# Patient Record
Sex: Male | Born: 1975 | Race: White | Hispanic: No | Marital: Married | Smoking: Never smoker
Health system: Southern US, Community
[De-identification: ages and names within clinical notes are randomized; demographics above are authoritative.]

---

## 2009-05-16 DIAGNOSIS — D229 Melanocytic nevi, unspecified: Secondary | ICD-10-CM

## 2009-05-16 HISTORY — DX: Melanocytic nevi, unspecified: D22.9

## 2010-02-26 ENCOUNTER — Observation Stay: Payer: Self-pay | Admitting: Internal Medicine

## 2010-02-26 ENCOUNTER — Ambulatory Visit: Payer: Self-pay | Admitting: Cardiovascular Disease

## 2010-03-06 ENCOUNTER — Ambulatory Visit: Payer: Self-pay | Admitting: Internal Medicine

## 2011-06-13 ENCOUNTER — Ambulatory Visit: Payer: Self-pay | Admitting: Unknown Physician Specialty

## 2013-09-03 ENCOUNTER — Ambulatory Visit: Payer: Self-pay | Admitting: Specialist

## 2014-07-30 NOTE — Op Note (Signed)
PATIENT NAME:  Dalton Huff, Dalton Huff MR#:  [id number] DATE OF BIRTH:  28-Apr-1975  DATE OF PROCEDURE:  09/03/2013  PREOPERATIVE DIAGNOSIS: Bilateral carpal tunnel syndrome.   POSTOPERATIVE DIAGNOSIS: Bilateral carpal tunnel syndrome.   PROCEDURES: 1.  Right carpal tunnel release.  2.  Left carpal tunnel release.   SURGEON: Christophe Louis, M.D.   ANESTHESIA: General.   COMPLICATIONS: None.   TOURNIQUET TIME: Approximately 15 minutes on each side.   DESCRIPTION OF PROCEDURE: After adequate induction of general anesthesia, both upper extremities are thoroughly prepped with alcohol and ChloraPrep and draped in standard sterile fashion. Identical procedures are performed on each side. The extremity is wrapped out with the Esmarch bandage and tourniquet elevated to 250 mmHg. On the right side, because of the IV placement in the elbow, with the Esmarch itself was used as the tourniquet. Under loupe magnification, standard volar carpal tunnel incision is made and the dissection carried down to the transverse retinacular ligament. This is incised in the midportion with the knife. The proximal release is performed with the small scissors and the carpal tunnel scissors, and the distal release is performed with the small scissors. There is seen to be moderate compression of the nerve directly beneath the ligament on each side. Careful check is made both proximally and distally to ensure that complete release has been obtained. The wound is thoroughly irrigated multiple times. Skin edges are infiltrated with 0.5% plain Marcaine. The skin is closed with 4-0 nylon. A soft bulky dressing is applied. The tourniquet is released. The patient is returned to the recovery room in satisfactory condition having tolerated the procedures well.    ____________________________ Lucas Mallow, MD ces:dmm D: 09/03/2013 09:11:30 ET T: 09/03/2013 09:36:15 ET JOB#: [id number]  cc: Lucas Mallow, MD,  <Dictator> Lucas Mallow MD ELECTRONICALLY SIGNED 09/11/2013 12:38

## 2015-04-13 DIAGNOSIS — J301 Allergic rhinitis due to pollen: Secondary | ICD-10-CM | POA: Diagnosis not present

## 2015-04-17 DIAGNOSIS — J301 Allergic rhinitis due to pollen: Secondary | ICD-10-CM | POA: Diagnosis not present

## 2015-04-20 DIAGNOSIS — J301 Allergic rhinitis due to pollen: Secondary | ICD-10-CM | POA: Diagnosis not present

## 2015-04-27 DIAGNOSIS — J301 Allergic rhinitis due to pollen: Secondary | ICD-10-CM | POA: Diagnosis not present

## 2015-05-01 DIAGNOSIS — J301 Allergic rhinitis due to pollen: Secondary | ICD-10-CM | POA: Diagnosis not present

## 2015-05-04 DIAGNOSIS — J301 Allergic rhinitis due to pollen: Secondary | ICD-10-CM | POA: Diagnosis not present

## 2015-05-05 DIAGNOSIS — J301 Allergic rhinitis due to pollen: Secondary | ICD-10-CM | POA: Diagnosis not present

## 2015-05-08 DIAGNOSIS — J301 Allergic rhinitis due to pollen: Secondary | ICD-10-CM | POA: Diagnosis not present

## 2015-05-10 DIAGNOSIS — G4733 Obstructive sleep apnea (adult) (pediatric): Secondary | ICD-10-CM | POA: Diagnosis not present

## 2015-05-11 DIAGNOSIS — J301 Allergic rhinitis due to pollen: Secondary | ICD-10-CM | POA: Diagnosis not present

## 2015-05-15 DIAGNOSIS — J301 Allergic rhinitis due to pollen: Secondary | ICD-10-CM | POA: Diagnosis not present

## 2015-05-16 DIAGNOSIS — H8111 Benign paroxysmal vertigo, right ear: Secondary | ICD-10-CM | POA: Diagnosis not present

## 2015-05-18 DIAGNOSIS — H8112 Benign paroxysmal vertigo, left ear: Secondary | ICD-10-CM | POA: Diagnosis not present

## 2015-05-18 DIAGNOSIS — J301 Allergic rhinitis due to pollen: Secondary | ICD-10-CM | POA: Diagnosis not present

## 2015-05-19 DIAGNOSIS — J301 Allergic rhinitis due to pollen: Secondary | ICD-10-CM | POA: Diagnosis not present

## 2015-05-22 DIAGNOSIS — J301 Allergic rhinitis due to pollen: Secondary | ICD-10-CM | POA: Diagnosis not present

## 2015-05-24 DIAGNOSIS — H8110 Benign paroxysmal vertigo, unspecified ear: Secondary | ICD-10-CM | POA: Diagnosis not present

## 2015-05-25 DIAGNOSIS — J301 Allergic rhinitis due to pollen: Secondary | ICD-10-CM | POA: Diagnosis not present

## 2015-05-29 DIAGNOSIS — J301 Allergic rhinitis due to pollen: Secondary | ICD-10-CM | POA: Diagnosis not present

## 2015-05-30 DIAGNOSIS — R42 Dizziness and giddiness: Secondary | ICD-10-CM | POA: Diagnosis not present

## 2015-06-01 DIAGNOSIS — J301 Allergic rhinitis due to pollen: Secondary | ICD-10-CM | POA: Diagnosis not present

## 2015-06-05 DIAGNOSIS — J301 Allergic rhinitis due to pollen: Secondary | ICD-10-CM | POA: Diagnosis not present

## 2015-06-08 DIAGNOSIS — J301 Allergic rhinitis due to pollen: Secondary | ICD-10-CM | POA: Diagnosis not present

## 2015-06-09 DIAGNOSIS — J301 Allergic rhinitis due to pollen: Secondary | ICD-10-CM | POA: Diagnosis not present

## 2015-06-12 DIAGNOSIS — J301 Allergic rhinitis due to pollen: Secondary | ICD-10-CM | POA: Diagnosis not present

## 2015-06-15 DIAGNOSIS — J301 Allergic rhinitis due to pollen: Secondary | ICD-10-CM | POA: Diagnosis not present

## 2015-06-19 DIAGNOSIS — J301 Allergic rhinitis due to pollen: Secondary | ICD-10-CM | POA: Diagnosis not present

## 2015-06-22 DIAGNOSIS — J301 Allergic rhinitis due to pollen: Secondary | ICD-10-CM | POA: Diagnosis not present

## 2015-06-29 DIAGNOSIS — J301 Allergic rhinitis due to pollen: Secondary | ICD-10-CM | POA: Diagnosis not present

## 2015-07-03 DIAGNOSIS — J301 Allergic rhinitis due to pollen: Secondary | ICD-10-CM | POA: Diagnosis not present

## 2015-07-06 DIAGNOSIS — J301 Allergic rhinitis due to pollen: Secondary | ICD-10-CM | POA: Diagnosis not present

## 2015-07-07 DIAGNOSIS — J301 Allergic rhinitis due to pollen: Secondary | ICD-10-CM | POA: Diagnosis not present

## 2015-07-10 DIAGNOSIS — J301 Allergic rhinitis due to pollen: Secondary | ICD-10-CM | POA: Diagnosis not present

## 2015-07-13 DIAGNOSIS — J301 Allergic rhinitis due to pollen: Secondary | ICD-10-CM | POA: Diagnosis not present

## 2015-07-20 DIAGNOSIS — J301 Allergic rhinitis due to pollen: Secondary | ICD-10-CM | POA: Diagnosis not present

## 2015-07-24 DIAGNOSIS — J301 Allergic rhinitis due to pollen: Secondary | ICD-10-CM | POA: Diagnosis not present

## 2015-07-27 DIAGNOSIS — J301 Allergic rhinitis due to pollen: Secondary | ICD-10-CM | POA: Diagnosis not present

## 2015-07-31 DIAGNOSIS — J301 Allergic rhinitis due to pollen: Secondary | ICD-10-CM | POA: Diagnosis not present

## 2015-08-08 DIAGNOSIS — G4733 Obstructive sleep apnea (adult) (pediatric): Secondary | ICD-10-CM | POA: Diagnosis not present

## 2015-08-09 DIAGNOSIS — J301 Allergic rhinitis due to pollen: Secondary | ICD-10-CM | POA: Diagnosis not present

## 2015-08-16 DIAGNOSIS — J301 Allergic rhinitis due to pollen: Secondary | ICD-10-CM | POA: Diagnosis not present

## 2015-09-07 DIAGNOSIS — J301 Allergic rhinitis due to pollen: Secondary | ICD-10-CM | POA: Diagnosis not present

## 2015-09-13 DIAGNOSIS — R42 Dizziness and giddiness: Secondary | ICD-10-CM | POA: Diagnosis not present

## 2015-09-13 DIAGNOSIS — J301 Allergic rhinitis due to pollen: Secondary | ICD-10-CM | POA: Diagnosis not present

## 2015-09-14 DIAGNOSIS — J301 Allergic rhinitis due to pollen: Secondary | ICD-10-CM | POA: Diagnosis not present

## 2015-09-25 DIAGNOSIS — J301 Allergic rhinitis due to pollen: Secondary | ICD-10-CM | POA: Diagnosis not present

## 2015-09-29 DIAGNOSIS — J301 Allergic rhinitis due to pollen: Secondary | ICD-10-CM | POA: Diagnosis not present

## 2015-10-03 DIAGNOSIS — J301 Allergic rhinitis due to pollen: Secondary | ICD-10-CM | POA: Diagnosis not present

## 2015-11-08 DIAGNOSIS — G4733 Obstructive sleep apnea (adult) (pediatric): Secondary | ICD-10-CM | POA: Diagnosis not present

## 2016-01-02 DIAGNOSIS — I10 Essential (primary) hypertension: Secondary | ICD-10-CM | POA: Diagnosis not present

## 2016-01-02 DIAGNOSIS — Z79899 Other long term (current) drug therapy: Secondary | ICD-10-CM | POA: Diagnosis not present

## 2016-01-02 DIAGNOSIS — J329 Chronic sinusitis, unspecified: Secondary | ICD-10-CM | POA: Diagnosis not present

## 2016-01-02 DIAGNOSIS — Z0000 Encounter for general adult medical examination without abnormal findings: Secondary | ICD-10-CM | POA: Diagnosis not present

## 2016-01-03 DIAGNOSIS — Z1329 Encounter for screening for other suspected endocrine disorder: Secondary | ICD-10-CM | POA: Diagnosis not present

## 2016-01-03 DIAGNOSIS — Z13220 Encounter for screening for lipoid disorders: Secondary | ICD-10-CM | POA: Diagnosis not present

## 2016-01-03 DIAGNOSIS — F39 Unspecified mood [affective] disorder: Secondary | ICD-10-CM | POA: Diagnosis not present

## 2016-01-03 DIAGNOSIS — Z125 Encounter for screening for malignant neoplasm of prostate: Secondary | ICD-10-CM | POA: Diagnosis not present

## 2016-01-03 DIAGNOSIS — Z79899 Other long term (current) drug therapy: Secondary | ICD-10-CM | POA: Diagnosis not present

## 2016-01-03 DIAGNOSIS — Z131 Encounter for screening for diabetes mellitus: Secondary | ICD-10-CM | POA: Diagnosis not present

## 2016-01-03 DIAGNOSIS — I10 Essential (primary) hypertension: Secondary | ICD-10-CM | POA: Diagnosis not present

## 2016-02-08 DIAGNOSIS — G4733 Obstructive sleep apnea (adult) (pediatric): Secondary | ICD-10-CM | POA: Diagnosis not present

## 2016-02-13 DIAGNOSIS — I10 Essential (primary) hypertension: Secondary | ICD-10-CM | POA: Diagnosis not present

## 2016-02-13 DIAGNOSIS — Z23 Encounter for immunization: Secondary | ICD-10-CM | POA: Diagnosis not present

## 2016-03-08 DIAGNOSIS — J301 Allergic rhinitis due to pollen: Secondary | ICD-10-CM | POA: Diagnosis not present

## 2016-05-10 DIAGNOSIS — G4733 Obstructive sleep apnea (adult) (pediatric): Secondary | ICD-10-CM | POA: Diagnosis not present

## 2016-05-20 DIAGNOSIS — J301 Allergic rhinitis due to pollen: Secondary | ICD-10-CM | POA: Diagnosis not present

## 2016-06-03 DIAGNOSIS — J301 Allergic rhinitis due to pollen: Secondary | ICD-10-CM | POA: Diagnosis not present

## 2016-06-10 DIAGNOSIS — J301 Allergic rhinitis due to pollen: Secondary | ICD-10-CM | POA: Diagnosis not present

## 2016-06-11 DIAGNOSIS — J301 Allergic rhinitis due to pollen: Secondary | ICD-10-CM | POA: Diagnosis not present

## 2016-06-13 DIAGNOSIS — J0140 Acute pansinusitis, unspecified: Secondary | ICD-10-CM | POA: Diagnosis not present

## 2016-06-13 DIAGNOSIS — R42 Dizziness and giddiness: Secondary | ICD-10-CM | POA: Diagnosis not present

## 2016-07-08 DIAGNOSIS — J301 Allergic rhinitis due to pollen: Secondary | ICD-10-CM | POA: Diagnosis not present

## 2016-07-18 DIAGNOSIS — J301 Allergic rhinitis due to pollen: Secondary | ICD-10-CM | POA: Diagnosis not present

## 2016-07-31 DIAGNOSIS — J301 Allergic rhinitis due to pollen: Secondary | ICD-10-CM | POA: Diagnosis not present

## 2016-08-13 DIAGNOSIS — R7309 Other abnormal glucose: Secondary | ICD-10-CM | POA: Diagnosis not present

## 2016-08-13 DIAGNOSIS — I10 Essential (primary) hypertension: Secondary | ICD-10-CM | POA: Diagnosis not present

## 2016-08-13 DIAGNOSIS — G4733 Obstructive sleep apnea (adult) (pediatric): Secondary | ICD-10-CM | POA: Diagnosis not present

## 2016-08-13 DIAGNOSIS — Z79899 Other long term (current) drug therapy: Secondary | ICD-10-CM | POA: Diagnosis not present

## 2016-09-11 DIAGNOSIS — G4733 Obstructive sleep apnea (adult) (pediatric): Secondary | ICD-10-CM | POA: Diagnosis not present

## 2016-09-11 DIAGNOSIS — J0140 Acute pansinusitis, unspecified: Secondary | ICD-10-CM | POA: Diagnosis not present

## 2016-09-11 DIAGNOSIS — J301 Allergic rhinitis due to pollen: Secondary | ICD-10-CM | POA: Diagnosis not present

## 2016-09-13 DIAGNOSIS — J301 Allergic rhinitis due to pollen: Secondary | ICD-10-CM | POA: Diagnosis not present

## 2016-10-02 DIAGNOSIS — G4733 Obstructive sleep apnea (adult) (pediatric): Secondary | ICD-10-CM | POA: Diagnosis not present

## 2016-10-07 DIAGNOSIS — J301 Allergic rhinitis due to pollen: Secondary | ICD-10-CM | POA: Diagnosis not present

## 2016-10-08 DIAGNOSIS — G4733 Obstructive sleep apnea (adult) (pediatric): Secondary | ICD-10-CM | POA: Diagnosis not present

## 2016-10-10 DIAGNOSIS — J301 Allergic rhinitis due to pollen: Secondary | ICD-10-CM | POA: Diagnosis not present

## 2016-10-23 DIAGNOSIS — J301 Allergic rhinitis due to pollen: Secondary | ICD-10-CM | POA: Diagnosis not present

## 2016-11-08 DIAGNOSIS — G4733 Obstructive sleep apnea (adult) (pediatric): Secondary | ICD-10-CM | POA: Diagnosis not present

## 2016-11-25 DIAGNOSIS — J301 Allergic rhinitis due to pollen: Secondary | ICD-10-CM | POA: Diagnosis not present

## 2016-12-06 DIAGNOSIS — J301 Allergic rhinitis due to pollen: Secondary | ICD-10-CM | POA: Diagnosis not present

## 2016-12-09 DIAGNOSIS — G4733 Obstructive sleep apnea (adult) (pediatric): Secondary | ICD-10-CM | POA: Diagnosis not present

## 2016-12-16 DIAGNOSIS — J301 Allergic rhinitis due to pollen: Secondary | ICD-10-CM | POA: Diagnosis not present

## 2016-12-19 DIAGNOSIS — M5412 Radiculopathy, cervical region: Secondary | ICD-10-CM | POA: Diagnosis not present

## 2016-12-19 DIAGNOSIS — M9901 Segmental and somatic dysfunction of cervical region: Secondary | ICD-10-CM | POA: Diagnosis not present

## 2016-12-19 DIAGNOSIS — M9902 Segmental and somatic dysfunction of thoracic region: Secondary | ICD-10-CM | POA: Diagnosis not present

## 2016-12-19 DIAGNOSIS — M62830 Muscle spasm of back: Secondary | ICD-10-CM | POA: Diagnosis not present

## 2016-12-23 DIAGNOSIS — M9901 Segmental and somatic dysfunction of cervical region: Secondary | ICD-10-CM | POA: Diagnosis not present

## 2016-12-23 DIAGNOSIS — M62830 Muscle spasm of back: Secondary | ICD-10-CM | POA: Diagnosis not present

## 2016-12-23 DIAGNOSIS — M5412 Radiculopathy, cervical region: Secondary | ICD-10-CM | POA: Diagnosis not present

## 2016-12-23 DIAGNOSIS — M9902 Segmental and somatic dysfunction of thoracic region: Secondary | ICD-10-CM | POA: Diagnosis not present

## 2016-12-24 DIAGNOSIS — M9902 Segmental and somatic dysfunction of thoracic region: Secondary | ICD-10-CM | POA: Diagnosis not present

## 2016-12-24 DIAGNOSIS — M9901 Segmental and somatic dysfunction of cervical region: Secondary | ICD-10-CM | POA: Diagnosis not present

## 2016-12-24 DIAGNOSIS — M62830 Muscle spasm of back: Secondary | ICD-10-CM | POA: Diagnosis not present

## 2016-12-24 DIAGNOSIS — M5412 Radiculopathy, cervical region: Secondary | ICD-10-CM | POA: Diagnosis not present

## 2016-12-26 DIAGNOSIS — M9902 Segmental and somatic dysfunction of thoracic region: Secondary | ICD-10-CM | POA: Diagnosis not present

## 2016-12-26 DIAGNOSIS — M62830 Muscle spasm of back: Secondary | ICD-10-CM | POA: Diagnosis not present

## 2016-12-26 DIAGNOSIS — M5412 Radiculopathy, cervical region: Secondary | ICD-10-CM | POA: Diagnosis not present

## 2016-12-26 DIAGNOSIS — M9901 Segmental and somatic dysfunction of cervical region: Secondary | ICD-10-CM | POA: Diagnosis not present

## 2016-12-30 DIAGNOSIS — M5412 Radiculopathy, cervical region: Secondary | ICD-10-CM | POA: Diagnosis not present

## 2016-12-30 DIAGNOSIS — J301 Allergic rhinitis due to pollen: Secondary | ICD-10-CM | POA: Diagnosis not present

## 2016-12-30 DIAGNOSIS — M9902 Segmental and somatic dysfunction of thoracic region: Secondary | ICD-10-CM | POA: Diagnosis not present

## 2016-12-30 DIAGNOSIS — M9901 Segmental and somatic dysfunction of cervical region: Secondary | ICD-10-CM | POA: Diagnosis not present

## 2016-12-30 DIAGNOSIS — M62830 Muscle spasm of back: Secondary | ICD-10-CM | POA: Diagnosis not present

## 2017-01-01 DIAGNOSIS — M62830 Muscle spasm of back: Secondary | ICD-10-CM | POA: Diagnosis not present

## 2017-01-01 DIAGNOSIS — M5412 Radiculopathy, cervical region: Secondary | ICD-10-CM | POA: Diagnosis not present

## 2017-01-01 DIAGNOSIS — M9902 Segmental and somatic dysfunction of thoracic region: Secondary | ICD-10-CM | POA: Diagnosis not present

## 2017-01-01 DIAGNOSIS — M9901 Segmental and somatic dysfunction of cervical region: Secondary | ICD-10-CM | POA: Diagnosis not present

## 2017-01-02 DIAGNOSIS — M62830 Muscle spasm of back: Secondary | ICD-10-CM | POA: Diagnosis not present

## 2017-01-02 DIAGNOSIS — M9902 Segmental and somatic dysfunction of thoracic region: Secondary | ICD-10-CM | POA: Diagnosis not present

## 2017-01-02 DIAGNOSIS — G4733 Obstructive sleep apnea (adult) (pediatric): Secondary | ICD-10-CM | POA: Diagnosis not present

## 2017-01-02 DIAGNOSIS — M9901 Segmental and somatic dysfunction of cervical region: Secondary | ICD-10-CM | POA: Diagnosis not present

## 2017-01-02 DIAGNOSIS — M5412 Radiculopathy, cervical region: Secondary | ICD-10-CM | POA: Diagnosis not present

## 2017-01-06 DIAGNOSIS — M9901 Segmental and somatic dysfunction of cervical region: Secondary | ICD-10-CM | POA: Diagnosis not present

## 2017-01-06 DIAGNOSIS — M9902 Segmental and somatic dysfunction of thoracic region: Secondary | ICD-10-CM | POA: Diagnosis not present

## 2017-01-06 DIAGNOSIS — J301 Allergic rhinitis due to pollen: Secondary | ICD-10-CM | POA: Diagnosis not present

## 2017-01-06 DIAGNOSIS — M5412 Radiculopathy, cervical region: Secondary | ICD-10-CM | POA: Diagnosis not present

## 2017-01-06 DIAGNOSIS — M62830 Muscle spasm of back: Secondary | ICD-10-CM | POA: Diagnosis not present

## 2017-01-08 DIAGNOSIS — G4733 Obstructive sleep apnea (adult) (pediatric): Secondary | ICD-10-CM | POA: Diagnosis not present

## 2017-01-08 DIAGNOSIS — M62830 Muscle spasm of back: Secondary | ICD-10-CM | POA: Diagnosis not present

## 2017-01-08 DIAGNOSIS — M5412 Radiculopathy, cervical region: Secondary | ICD-10-CM | POA: Diagnosis not present

## 2017-01-08 DIAGNOSIS — M9901 Segmental and somatic dysfunction of cervical region: Secondary | ICD-10-CM | POA: Diagnosis not present

## 2017-01-08 DIAGNOSIS — M9902 Segmental and somatic dysfunction of thoracic region: Secondary | ICD-10-CM | POA: Diagnosis not present

## 2017-01-09 DIAGNOSIS — M5412 Radiculopathy, cervical region: Secondary | ICD-10-CM | POA: Diagnosis not present

## 2017-01-09 DIAGNOSIS — M9901 Segmental and somatic dysfunction of cervical region: Secondary | ICD-10-CM | POA: Diagnosis not present

## 2017-01-09 DIAGNOSIS — M62830 Muscle spasm of back: Secondary | ICD-10-CM | POA: Diagnosis not present

## 2017-01-09 DIAGNOSIS — M9902 Segmental and somatic dysfunction of thoracic region: Secondary | ICD-10-CM | POA: Diagnosis not present

## 2017-01-13 DIAGNOSIS — J301 Allergic rhinitis due to pollen: Secondary | ICD-10-CM | POA: Diagnosis not present

## 2017-01-14 DIAGNOSIS — M9901 Segmental and somatic dysfunction of cervical region: Secondary | ICD-10-CM | POA: Diagnosis not present

## 2017-01-14 DIAGNOSIS — M5412 Radiculopathy, cervical region: Secondary | ICD-10-CM | POA: Diagnosis not present

## 2017-01-14 DIAGNOSIS — M9902 Segmental and somatic dysfunction of thoracic region: Secondary | ICD-10-CM | POA: Diagnosis not present

## 2017-01-14 DIAGNOSIS — M62830 Muscle spasm of back: Secondary | ICD-10-CM | POA: Diagnosis not present

## 2017-01-16 DIAGNOSIS — M5412 Radiculopathy, cervical region: Secondary | ICD-10-CM | POA: Diagnosis not present

## 2017-01-16 DIAGNOSIS — M62830 Muscle spasm of back: Secondary | ICD-10-CM | POA: Diagnosis not present

## 2017-01-16 DIAGNOSIS — M9901 Segmental and somatic dysfunction of cervical region: Secondary | ICD-10-CM | POA: Diagnosis not present

## 2017-01-16 DIAGNOSIS — M9902 Segmental and somatic dysfunction of thoracic region: Secondary | ICD-10-CM | POA: Diagnosis not present

## 2017-01-20 DIAGNOSIS — J301 Allergic rhinitis due to pollen: Secondary | ICD-10-CM | POA: Diagnosis not present

## 2017-02-04 DIAGNOSIS — R7309 Other abnormal glucose: Secondary | ICD-10-CM | POA: Diagnosis not present

## 2017-02-04 DIAGNOSIS — Z13220 Encounter for screening for lipoid disorders: Secondary | ICD-10-CM | POA: Diagnosis not present

## 2017-02-04 DIAGNOSIS — Z125 Encounter for screening for malignant neoplasm of prostate: Secondary | ICD-10-CM | POA: Diagnosis not present

## 2017-02-04 DIAGNOSIS — Z1329 Encounter for screening for other suspected endocrine disorder: Secondary | ICD-10-CM | POA: Diagnosis not present

## 2017-02-04 DIAGNOSIS — I10 Essential (primary) hypertension: Secondary | ICD-10-CM | POA: Diagnosis not present

## 2017-02-04 DIAGNOSIS — R7989 Other specified abnormal findings of blood chemistry: Secondary | ICD-10-CM | POA: Diagnosis not present

## 2017-02-04 DIAGNOSIS — Z79899 Other long term (current) drug therapy: Secondary | ICD-10-CM | POA: Diagnosis not present

## 2017-02-06 DIAGNOSIS — J301 Allergic rhinitis due to pollen: Secondary | ICD-10-CM | POA: Diagnosis not present

## 2017-02-10 DIAGNOSIS — J301 Allergic rhinitis due to pollen: Secondary | ICD-10-CM | POA: Diagnosis not present

## 2017-02-11 DIAGNOSIS — I10 Essential (primary) hypertension: Secondary | ICD-10-CM | POA: Diagnosis not present

## 2017-02-11 DIAGNOSIS — Z0000 Encounter for general adult medical examination without abnormal findings: Secondary | ICD-10-CM | POA: Diagnosis not present

## 2017-02-11 DIAGNOSIS — G4733 Obstructive sleep apnea (adult) (pediatric): Secondary | ICD-10-CM | POA: Diagnosis not present

## 2017-02-11 DIAGNOSIS — E7800 Pure hypercholesterolemia, unspecified: Secondary | ICD-10-CM | POA: Diagnosis not present

## 2017-02-17 DIAGNOSIS — J301 Allergic rhinitis due to pollen: Secondary | ICD-10-CM | POA: Diagnosis not present

## 2017-03-07 DIAGNOSIS — J301 Allergic rhinitis due to pollen: Secondary | ICD-10-CM | POA: Diagnosis not present

## 2017-03-26 DIAGNOSIS — G4733 Obstructive sleep apnea (adult) (pediatric): Secondary | ICD-10-CM | POA: Diagnosis not present

## 2017-04-02 DIAGNOSIS — J301 Allergic rhinitis due to pollen: Secondary | ICD-10-CM | POA: Diagnosis not present

## 2017-04-14 DIAGNOSIS — J301 Allergic rhinitis due to pollen: Secondary | ICD-10-CM | POA: Diagnosis not present

## 2017-04-22 DIAGNOSIS — M9901 Segmental and somatic dysfunction of cervical region: Secondary | ICD-10-CM | POA: Diagnosis not present

## 2017-04-22 DIAGNOSIS — M62830 Muscle spasm of back: Secondary | ICD-10-CM | POA: Diagnosis not present

## 2017-04-22 DIAGNOSIS — M9902 Segmental and somatic dysfunction of thoracic region: Secondary | ICD-10-CM | POA: Diagnosis not present

## 2017-04-22 DIAGNOSIS — M5412 Radiculopathy, cervical region: Secondary | ICD-10-CM | POA: Diagnosis not present

## 2017-05-20 DIAGNOSIS — M5412 Radiculopathy, cervical region: Secondary | ICD-10-CM | POA: Diagnosis not present

## 2017-05-20 DIAGNOSIS — M62830 Muscle spasm of back: Secondary | ICD-10-CM | POA: Diagnosis not present

## 2017-05-20 DIAGNOSIS — M9901 Segmental and somatic dysfunction of cervical region: Secondary | ICD-10-CM | POA: Diagnosis not present

## 2017-05-20 DIAGNOSIS — M9902 Segmental and somatic dysfunction of thoracic region: Secondary | ICD-10-CM | POA: Diagnosis not present

## 2017-05-30 DIAGNOSIS — J301 Allergic rhinitis due to pollen: Secondary | ICD-10-CM | POA: Diagnosis not present

## 2017-06-09 DIAGNOSIS — G4733 Obstructive sleep apnea (adult) (pediatric): Secondary | ICD-10-CM | POA: Diagnosis not present

## 2017-06-17 DIAGNOSIS — M9902 Segmental and somatic dysfunction of thoracic region: Secondary | ICD-10-CM | POA: Diagnosis not present

## 2017-06-17 DIAGNOSIS — M5412 Radiculopathy, cervical region: Secondary | ICD-10-CM | POA: Diagnosis not present

## 2017-06-17 DIAGNOSIS — M62830 Muscle spasm of back: Secondary | ICD-10-CM | POA: Diagnosis not present

## 2017-06-17 DIAGNOSIS — M9901 Segmental and somatic dysfunction of cervical region: Secondary | ICD-10-CM | POA: Diagnosis not present

## 2017-06-30 DIAGNOSIS — J301 Allergic rhinitis due to pollen: Secondary | ICD-10-CM | POA: Diagnosis not present

## 2017-07-10 DIAGNOSIS — J301 Allergic rhinitis due to pollen: Secondary | ICD-10-CM | POA: Diagnosis not present

## 2017-07-14 DIAGNOSIS — J301 Allergic rhinitis due to pollen: Secondary | ICD-10-CM | POA: Diagnosis not present

## 2017-07-15 DIAGNOSIS — M62830 Muscle spasm of back: Secondary | ICD-10-CM | POA: Diagnosis not present

## 2017-07-15 DIAGNOSIS — M9901 Segmental and somatic dysfunction of cervical region: Secondary | ICD-10-CM | POA: Diagnosis not present

## 2017-07-15 DIAGNOSIS — M5412 Radiculopathy, cervical region: Secondary | ICD-10-CM | POA: Diagnosis not present

## 2017-07-15 DIAGNOSIS — M9902 Segmental and somatic dysfunction of thoracic region: Secondary | ICD-10-CM | POA: Diagnosis not present

## 2017-07-23 ENCOUNTER — Encounter: Payer: 59 | Attending: Internal Medicine | Admitting: Dietician

## 2017-07-23 ENCOUNTER — Encounter: Payer: Self-pay | Admitting: Dietician

## 2017-07-23 VITALS — Ht 64.0 in | Wt 220.2 lb

## 2017-07-23 DIAGNOSIS — M5412 Radiculopathy, cervical region: Secondary | ICD-10-CM | POA: Diagnosis not present

## 2017-07-23 DIAGNOSIS — Z6837 Body mass index (BMI) 37.0-37.9, adult: Secondary | ICD-10-CM | POA: Diagnosis not present

## 2017-07-23 DIAGNOSIS — E669 Obesity, unspecified: Secondary | ICD-10-CM | POA: Insufficient documentation

## 2017-07-23 DIAGNOSIS — M9901 Segmental and somatic dysfunction of cervical region: Secondary | ICD-10-CM | POA: Diagnosis not present

## 2017-07-23 DIAGNOSIS — M62830 Muscle spasm of back: Secondary | ICD-10-CM | POA: Diagnosis not present

## 2017-07-23 DIAGNOSIS — Z713 Dietary counseling and surveillance: Secondary | ICD-10-CM | POA: Diagnosis not present

## 2017-07-23 DIAGNOSIS — J301 Allergic rhinitis due to pollen: Secondary | ICD-10-CM | POA: Diagnosis not present

## 2017-07-23 DIAGNOSIS — E6609 Other obesity due to excess calories: Secondary | ICD-10-CM

## 2017-07-23 DIAGNOSIS — M9902 Segmental and somatic dysfunction of thoracic region: Secondary | ICD-10-CM | POA: Diagnosis not present

## 2017-07-23 NOTE — Progress Notes (Signed)
Medical Nutrition Therapy: Visit start time: 1941  end time: 1430 Assessment:  Diagnosis: obesity Past medical history: see chart Psychosocial issues/ stress concerns: none Preferred learning method:  . Auditory . Visual . Hands-on  Current weight: 220.2#  Height: 5\' 4"  Medications, supplements: Zyrtec  Progress and evaluation: Patient is seeking dietary guidance to find a more sustainable approach to eating which provides him energy and keeps him feeling full. Up until last week he had been following the ketogenic diet loosely since September 2018 and more rigidly since January 2019 where 5-10% of his daily caloric intake included carbohydrate. Reports "falling off" this week d/t increased life stressors and also reports to have taken several weekends "off" while following the diet. Prior to September 2018 he had been following a low fat diet where his eating pattern included 6 meals / day. Reported feeling hungry often and more sluggish on a higher carbohydrate diet. With the ketogenic diet + intermittent fasting he felt less hungry and higher energy levels. Would fast 4-5 hours after his morning workouts. Currently uses MyFitnessPal to track his daily calories/ macro targets and weights himself daily. He is interested in utilizing flexible dieting and following a higher-fat (non-keto) diet to help achieve more balance and to lose weight.   Physical activity: Resistance training and cardio 5-6 days / week for 45-90 min each session. Works out fasted in the mornings.  Dietary Intake:  Usual eating pattern includes 2 meals and 0-1 snacks per day. Dining out frequency: 5 meals per week.  Breakfast: none usually Snack: none Lunch: Tangent (3 shrimp / korean beef + veggie tacos) Snack: macadamia nuts or pecans - rarely Supper: hamburger sandwich + grits, vegetables, chicken Snack: none Beverages: coffee (heavy cream on keto or sugar sometimes), water, unsweetened iced tea, diet  soda  Nutrition Care Education:m Topics covered: ketogenic diet vs. Higher fat diet with less restriction, role that carbohydrates play in weight lifting, intermittent fasting, sustainable non-diet approaches to eating, estimated daily calorie needs for activity level, nutrient timing, Basic nutrition: basic food groups, appropriate nutrient balance, appropriate meal and snack schedule, general nutrition guidelines    Weight control: benefits of weight control, identifying healthy weight, determining reasonable weight goal, behavioral changes for weight loss Advanced nutrition: dining out, counting macros and providing macro percentages, flexible dieting Other lifestyle changes: benefits of making changes, readiness for change, identifying habits that need to change  Nutritional Diagnosis:  Carl-3.3 Overweight/obesity As related to dietary habits, coming off of the ketogenic diet.  As evidenced by BMI 37.8, patient report.  Intervention: Discussion as noted above. He will trial new / less restrictive macronutrient distribution and daily calorie target until our next session, assessing for energy levels, fullness/ hunger and ability to make this way of eating sustainable long-term. Initial wt loss goal of 10# was set. He will also focus on eating a post-workout meal that is protein and carbohydrate dense. To assess diet quality in more detail next session.  Education Materials given:  Marland Kitchen Goals/ instructions  Learner/ who was taught:  . Patient  Level of understanding: Marland Kitchen Verbalizes/ demonstrates competency  Demonstrated degree of understanding via:   Teach back Learning barriers: . None  Willingness to learn/ readiness for change: . Eager, change in progress  Monitoring and Evaluation:  Dietary intake, exercise, hunger / satiety, and body weight      follow up: in 3 week(s): May 8th

## 2017-07-23 NOTE — Patient Instructions (Addendum)
   Set initial weight loss goal of 10# with weight loss from 0.5-2# of body weight per week  Maintenance calories with current activity: 2700 calories  Weight loss: subtract 250-500 calories per day and adjust as needed  Macro percentage ideas: 30% P, 40% C, 30% F OR 30% P, 30-35% C, 35-40% F  Plan carbohydrate intake around your workouts. Post workout meal should contain both protein and carbohydate

## 2017-07-28 DIAGNOSIS — J301 Allergic rhinitis due to pollen: Secondary | ICD-10-CM | POA: Diagnosis not present

## 2017-08-04 DIAGNOSIS — J301 Allergic rhinitis due to pollen: Secondary | ICD-10-CM | POA: Diagnosis not present

## 2017-08-06 DIAGNOSIS — E7800 Pure hypercholesterolemia, unspecified: Secondary | ICD-10-CM | POA: Diagnosis not present

## 2017-08-06 DIAGNOSIS — R7309 Other abnormal glucose: Secondary | ICD-10-CM | POA: Diagnosis not present

## 2017-08-11 DIAGNOSIS — J301 Allergic rhinitis due to pollen: Secondary | ICD-10-CM | POA: Diagnosis not present

## 2017-08-13 ENCOUNTER — Ambulatory Visit: Payer: 59 | Admitting: Dietician

## 2017-08-13 DIAGNOSIS — M9902 Segmental and somatic dysfunction of thoracic region: Secondary | ICD-10-CM | POA: Diagnosis not present

## 2017-08-13 DIAGNOSIS — M62830 Muscle spasm of back: Secondary | ICD-10-CM | POA: Diagnosis not present

## 2017-08-13 DIAGNOSIS — I10 Essential (primary) hypertension: Secondary | ICD-10-CM | POA: Diagnosis not present

## 2017-08-13 DIAGNOSIS — M5412 Radiculopathy, cervical region: Secondary | ICD-10-CM | POA: Diagnosis not present

## 2017-08-13 DIAGNOSIS — M9901 Segmental and somatic dysfunction of cervical region: Secondary | ICD-10-CM | POA: Diagnosis not present

## 2017-08-13 DIAGNOSIS — E7800 Pure hypercholesterolemia, unspecified: Secondary | ICD-10-CM | POA: Diagnosis not present

## 2017-08-22 DIAGNOSIS — J301 Allergic rhinitis due to pollen: Secondary | ICD-10-CM | POA: Diagnosis not present

## 2017-08-25 DIAGNOSIS — J301 Allergic rhinitis due to pollen: Secondary | ICD-10-CM | POA: Diagnosis not present

## 2017-08-25 DIAGNOSIS — G4733 Obstructive sleep apnea (adult) (pediatric): Secondary | ICD-10-CM | POA: Diagnosis not present

## 2017-08-29 ENCOUNTER — Encounter: Payer: 59 | Attending: Internal Medicine | Admitting: Dietician

## 2017-08-29 ENCOUNTER — Encounter: Payer: Self-pay | Admitting: Dietician

## 2017-08-29 VITALS — Wt 222.9 lb

## 2017-08-29 DIAGNOSIS — S40869A Insect bite (nonvenomous) of unspecified upper arm, initial encounter: Secondary | ICD-10-CM | POA: Diagnosis not present

## 2017-08-29 DIAGNOSIS — E669 Obesity, unspecified: Secondary | ICD-10-CM | POA: Insufficient documentation

## 2017-08-29 DIAGNOSIS — L919 Hypertrophic disorder of the skin, unspecified: Secondary | ICD-10-CM | POA: Diagnosis not present

## 2017-08-29 DIAGNOSIS — Z1283 Encounter for screening for malignant neoplasm of skin: Secondary | ICD-10-CM | POA: Diagnosis not present

## 2017-08-29 DIAGNOSIS — D485 Neoplasm of uncertain behavior of skin: Secondary | ICD-10-CM | POA: Diagnosis not present

## 2017-08-29 DIAGNOSIS — Z713 Dietary counseling and surveillance: Secondary | ICD-10-CM | POA: Diagnosis not present

## 2017-08-29 DIAGNOSIS — D2270 Melanocytic nevi of unspecified lower limb, including hip: Secondary | ICD-10-CM | POA: Diagnosis not present

## 2017-08-29 DIAGNOSIS — E6609 Other obesity due to excess calories: Secondary | ICD-10-CM

## 2017-08-29 DIAGNOSIS — L570 Actinic keratosis: Secondary | ICD-10-CM | POA: Diagnosis not present

## 2017-08-29 DIAGNOSIS — D225 Melanocytic nevi of trunk: Secondary | ICD-10-CM | POA: Diagnosis not present

## 2017-08-29 DIAGNOSIS — D1800 Hemangioma unspecified site: Secondary | ICD-10-CM | POA: Diagnosis not present

## 2017-08-29 DIAGNOSIS — Z6837 Body mass index (BMI) 37.0-37.9, adult: Secondary | ICD-10-CM | POA: Diagnosis not present

## 2017-08-29 DIAGNOSIS — Z6838 Body mass index (BMI) 38.0-38.9, adult: Secondary | ICD-10-CM

## 2017-08-29 DIAGNOSIS — D2260 Melanocytic nevi of unspecified upper limb, including shoulder: Secondary | ICD-10-CM | POA: Diagnosis not present

## 2017-08-29 DIAGNOSIS — L812 Freckles: Secondary | ICD-10-CM | POA: Diagnosis not present

## 2017-08-29 NOTE — Progress Notes (Signed)
Medical Nutrition Therapy: Visit start time: 1000  end time: 1030  Assessment:  Diagnosis: Obesity Medical history changes: none Psychosocial issues/ stress concerns: reports family/work life to be temporarily more stressful than normal  Current weight: 222.9#  Height: 5\' 4"  Medications, supplement changes: None  Progress and evaluation: Patient reports increased traveling, decreased physical activity, increased stress all which have impacted his food choices and consistency with keeping track of number of calories consumed. He has been trying to eat 2200 kcal/day, using a 40% fat/ 30% carb/ 30% protein split, and will overestimate calories when eating out d/t high likelihood of hidden calories/fat. Overall the family has been eating out more often but he tries to look up the nutrition information ahead of time when possible. Continues to fast until 12-1pm unless he works out in the morning. He feels that his energy levels and recovery from exercise have improved from eating a post-workout meal that contains carbs + protein + fat which he enjoys. Has been struggling slightly with the increased flexibility of adding more carbs to his diet, but knows that this will be a more sustainable approach for him long-term, rather than sticking to a strict high-fat diet and then "falling off" the plan every week or couple of weeks. Feels that he needs to be more conscious of the portions of carbs he is adding to meals and also of the amount of salt he his consuming d/t recent HTN. He had previously been adding salt to his diet while on keto. (08/13/17) BP 140/98, Chol 212 H, LDL 146 H which per his report labs have improved since last October. Overall his energy level is high and his sleep has improved.      Physical activity: Continues weight lifting and the occasional walking/cardio, but days in the gym have decreased to 2-3 days/week over the last couple of weeks rather than 5-6. Using lighter weights than he  typically would d/t back pain.  Dietary Intake:  Usual eating pattern includes 2-3 meals and 0-1 snacks per day. Dining out frequency: 5-7 meals per week.  Breakfast: usually skips unless working out. On workout days he will have eggs, bacon and toast without butter Snacks: none Lunch and Supper: the family has been eating out more d/t time constraints. [place] salad or spicy chicken sandwich, [employer], [place] burrito with chips are all typical choices  Nutrition Care Education: Topics covered: *Food Diary Weight management guidelinesgeneral, re-integrating CHO sources / controlled portions after following the keto diet, post workout nutrition and meal balance, learning portion sizes in order to transition off of tracking foods in MyFitness Pal, finding macro ratios that allow for flexibility of food choices when life gets stressful and routine is disrupted, lowering BP through exercise, eating out Basic nutrition: basic food groups, appropriate nutrient balance, appropriate meal and snack schedule, general nutrition guidelines    Weight control: benefits of weight control, determining reasonable weight goal (short-term goal of 3-5# wt loss x6 weeks), behavioral changes for weight loss Advanced nutrition: recipe modification, cooking techniques, dining out, food label reading Hypertension: importance of controlling BP, identifying high sodium foods, using less salt d/t being off the keto diet Hyperlipidemia: target goals for lipids, healthy and unhealthy fats Other lifestyle changes: benefits of making changes, increasing motivation, readiness for change, identifying habits that need to change  Nutritional Diagnosis:  NI-1.7 Predicted excessive energy intake As related to decreased physical activity and increased traveling.  As evidenced by patient report, abnormal food choices,  and wt gain of 3# since last session.  Intervention: Discussion as noted above. He will  practice paying closer attention to portion sizes, particularly fat and carbohydrates as well as the sodium content in foods d/t recent elevation in blood pressure and blood lipids. He would also like to learn proper portion sizes for his needs so that he can transition from tracking foods to "eyeballing" portion sizes. He will also start to get back into the habit of choosing lower fat items and eating at home more often. He will continue to track his calories in MyFitness Pal as consistently as possible until next session. Goal for wt loss is 3-5# x6 weeks.  Education Materials given:  Marland Kitchen Goals/ instructions  Learner/ who was taught:  . Patient   Level of understanding: Marland Kitchen Verbalizes/ demonstrates competency  Demonstrated degree of understanding via:   Teach back Learning barriers: . None  Willingness to learn/ readiness for change: . Eager, change in progress  Monitoring and Evaluation:  Dietary intake, exercise, and body weight      follow up: in 6 week(s): October 08, 2017

## 2017-09-08 DIAGNOSIS — J301 Allergic rhinitis due to pollen: Secondary | ICD-10-CM | POA: Diagnosis not present

## 2017-09-09 DIAGNOSIS — M9902 Segmental and somatic dysfunction of thoracic region: Secondary | ICD-10-CM | POA: Diagnosis not present

## 2017-09-09 DIAGNOSIS — M9901 Segmental and somatic dysfunction of cervical region: Secondary | ICD-10-CM | POA: Diagnosis not present

## 2017-09-09 DIAGNOSIS — M5412 Radiculopathy, cervical region: Secondary | ICD-10-CM | POA: Diagnosis not present

## 2017-09-09 DIAGNOSIS — M62830 Muscle spasm of back: Secondary | ICD-10-CM | POA: Diagnosis not present

## 2017-09-11 ENCOUNTER — Other Ambulatory Visit: Payer: Self-pay | Admitting: Unknown Physician Specialty

## 2017-09-11 DIAGNOSIS — G4733 Obstructive sleep apnea (adult) (pediatric): Secondary | ICD-10-CM | POA: Diagnosis not present

## 2017-09-11 DIAGNOSIS — R430 Anosmia: Secondary | ICD-10-CM

## 2017-09-11 DIAGNOSIS — J309 Allergic rhinitis, unspecified: Secondary | ICD-10-CM | POA: Diagnosis not present

## 2017-09-22 ENCOUNTER — Ambulatory Visit
Admission: RE | Admit: 2017-09-22 | Discharge: 2017-09-22 | Disposition: A | Discharge status: Home / Self Care | Payer: 59 | Source: Ambulatory Visit | Attending: Unknown Physician Specialty | Admitting: Unknown Physician Specialty

## 2017-09-22 DIAGNOSIS — R430 Anosmia: Secondary | ICD-10-CM | POA: Insufficient documentation

## 2017-09-22 MED ORDER — GADOBENATE DIMEGLUMINE 529 MG/ML IV SOLN
20.0000 mL | Freq: Once | INTRAVENOUS | Status: AC | PRN
  Administered 2017-09-22: 20 mL via INTRAVENOUS

## 2017-09-24 DIAGNOSIS — J301 Allergic rhinitis due to pollen: Secondary | ICD-10-CM | POA: Diagnosis not present

## 2017-10-07 DIAGNOSIS — M9901 Segmental and somatic dysfunction of cervical region: Secondary | ICD-10-CM | POA: Diagnosis not present

## 2017-10-07 DIAGNOSIS — M9902 Segmental and somatic dysfunction of thoracic region: Secondary | ICD-10-CM | POA: Diagnosis not present

## 2017-10-07 DIAGNOSIS — M62830 Muscle spasm of back: Secondary | ICD-10-CM | POA: Diagnosis not present

## 2017-10-07 DIAGNOSIS — M5412 Radiculopathy, cervical region: Secondary | ICD-10-CM | POA: Diagnosis not present

## 2017-10-08 ENCOUNTER — Encounter: Payer: 59 | Attending: Internal Medicine | Admitting: Dietician

## 2017-10-08 ENCOUNTER — Encounter: Payer: Self-pay | Admitting: Dietician

## 2017-10-08 VITALS — Wt 228.0 lb

## 2017-10-08 DIAGNOSIS — E669 Obesity, unspecified: Secondary | ICD-10-CM | POA: Insufficient documentation

## 2017-10-08 DIAGNOSIS — E6609 Other obesity due to excess calories: Secondary | ICD-10-CM

## 2017-10-08 DIAGNOSIS — Z713 Dietary counseling and surveillance: Secondary | ICD-10-CM | POA: Insufficient documentation

## 2017-10-08 DIAGNOSIS — Z6837 Body mass index (BMI) 37.0-37.9, adult: Secondary | ICD-10-CM | POA: Insufficient documentation

## 2017-10-08 DIAGNOSIS — Z6839 Body mass index (BMI) 39.0-39.9, adult: Secondary | ICD-10-CM

## 2017-10-08 NOTE — Progress Notes (Signed)
Medical Nutrition Therapy: Visit start time: 3500  end time: 9381  Assessment:  Diagnosis: Obesity Medical history changes: None Psychosocial issues/ stress concerns: the summer months are the most stressful for him job-wise  Current weight: 228#  Height: 5\' 4"  Medications, supplement changes: None  Progress and evaluation: Patient states that his schedule is mostly unchanged since last session. Continues to travel often, be exercising less, and eating out of the home often. He has found it difficult to stick to tracking meals due to his schedule and not always knowing what foods he will be provided to eat that day. Has been trying to eyeball portion sizes with his fists/ hand and has been eating salads before other meal components. Tries to eat larger portions of vegetables and protein than starches. Overall he feels that some days he has under-eaten and other days has over-eaten his 2200kcal/day goal. He prefers intermittent fasting and utilizes this when possible.   Physical activity: Has not been exercising consistently for the past few weeks d/t traveling for work. He joined a [employer] two weeks ago but has been unable to attend a class since joining. 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  Dietary Intake:  Usual eating pattern includes 2-3 meals and 0-1 snacks per day. 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 He has been trying to make vegetables and protein the highlights of the meals and to practice portion control. Has been eating 3 meals a  day, when he is used to eating 2 larger meals. Beverages: water  Nutrition Care Education: Topics covered: moderating a-typical foods, incorporating physical activity outside of the gym, strategies to pre-plan meals and exercise, flexible approach to eating, plate method Basic nutrition: basic food groups, appropriate nutrient balance, appropriate meal and snack schedule, general nutrition guidelines    Weight control: behavioral changes for weight loss Advanced nutrition: dining out Other lifestyle changes: benefits of making changes, identifying habits that need to change  Nutritional Diagnosis:  NI-1.7 Predicted excessive energy intake As related to decrease in physical activity, increased consumption of foods he does not typically eat.  As evidenced by weight gain of 5# x5 weeks, BMI 39.14.  Intervention: Discussion as noted above. He plans to keep working on being as flexible as possible with his diet while being off of his regular schedule due to increased work and traveling, where he is often not in control of the foods he has available to consume. He will work on using methods other than tracking in an app to portion out foods at meal times, but also use the tracking apps to learn appropriate portion sizes for his needs. 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 When August comes, he hopes to start eating at home more again and being on a more regular exercise schedule. He is considering continuing to utilize intermittent fasting as a dieting mechanism.  Education  Materials given:  Marland Kitchen Goals/ instructions  Learner/ who was taught:  . Patient   Level of understanding: Marland Kitchen Verbalizes/ demonstrates competency  Demonstrated degree of understanding via:   Teach back Learning barriers: . None  Willingness to learn/ readiness for change: . Eager, change in progress  Monitoring and  Evaluation:  Dietary intake, exercise, and body weight      follow up: prn: 3 of 3 sessions completed. E-mail and office phone number provided for future questions.

## 2017-10-22 DIAGNOSIS — J301 Allergic rhinitis due to pollen: Secondary | ICD-10-CM | POA: Diagnosis not present

## 2017-10-27 DIAGNOSIS — J301 Allergic rhinitis due to pollen: Secondary | ICD-10-CM | POA: Diagnosis not present

## 2017-11-04 DIAGNOSIS — M62830 Muscle spasm of back: Secondary | ICD-10-CM | POA: Diagnosis not present

## 2017-11-04 DIAGNOSIS — M5412 Radiculopathy, cervical region: Secondary | ICD-10-CM | POA: Diagnosis not present

## 2017-11-04 DIAGNOSIS — M9901 Segmental and somatic dysfunction of cervical region: Secondary | ICD-10-CM | POA: Diagnosis not present

## 2017-11-04 DIAGNOSIS — M9902 Segmental and somatic dysfunction of thoracic region: Secondary | ICD-10-CM | POA: Diagnosis not present

## 2017-11-12 DIAGNOSIS — J301 Allergic rhinitis due to pollen: Secondary | ICD-10-CM | POA: Diagnosis not present

## 2017-12-04 DIAGNOSIS — D225 Melanocytic nevi of trunk: Secondary | ICD-10-CM | POA: Diagnosis not present

## 2017-12-04 DIAGNOSIS — Z872 Personal history of diseases of the skin and subcutaneous tissue: Secondary | ICD-10-CM | POA: Diagnosis not present

## 2017-12-06 DIAGNOSIS — G4733 Obstructive sleep apnea (adult) (pediatric): Secondary | ICD-10-CM | POA: Diagnosis not present

## 2017-12-29 DIAGNOSIS — J301 Allergic rhinitis due to pollen: Secondary | ICD-10-CM | POA: Diagnosis not present

## 2018-01-05 DIAGNOSIS — J301 Allergic rhinitis due to pollen: Secondary | ICD-10-CM | POA: Diagnosis not present

## 2018-01-14 DIAGNOSIS — J301 Allergic rhinitis due to pollen: Secondary | ICD-10-CM | POA: Diagnosis not present

## 2018-02-02 DIAGNOSIS — M5412 Radiculopathy, cervical region: Secondary | ICD-10-CM | POA: Diagnosis not present

## 2018-02-02 DIAGNOSIS — J301 Allergic rhinitis due to pollen: Secondary | ICD-10-CM | POA: Diagnosis not present

## 2018-02-02 DIAGNOSIS — M9902 Segmental and somatic dysfunction of thoracic region: Secondary | ICD-10-CM | POA: Diagnosis not present

## 2018-02-02 DIAGNOSIS — M62830 Muscle spasm of back: Secondary | ICD-10-CM | POA: Diagnosis not present

## 2018-02-02 DIAGNOSIS — M9901 Segmental and somatic dysfunction of cervical region: Secondary | ICD-10-CM | POA: Diagnosis not present

## 2018-02-06 DIAGNOSIS — J301 Allergic rhinitis due to pollen: Secondary | ICD-10-CM | POA: Diagnosis not present

## 2018-02-12 DIAGNOSIS — Z125 Encounter for screening for malignant neoplasm of prostate: Secondary | ICD-10-CM | POA: Diagnosis not present

## 2018-02-12 DIAGNOSIS — Z79899 Other long term (current) drug therapy: Secondary | ICD-10-CM | POA: Diagnosis not present

## 2018-02-12 DIAGNOSIS — I10 Essential (primary) hypertension: Secondary | ICD-10-CM | POA: Diagnosis not present

## 2018-02-12 DIAGNOSIS — J301 Allergic rhinitis due to pollen: Secondary | ICD-10-CM | POA: Diagnosis not present

## 2018-02-12 DIAGNOSIS — R5383 Other fatigue: Secondary | ICD-10-CM | POA: Diagnosis not present

## 2018-02-12 DIAGNOSIS — E7800 Pure hypercholesterolemia, unspecified: Secondary | ICD-10-CM | POA: Diagnosis not present

## 2018-02-12 DIAGNOSIS — R7309 Other abnormal glucose: Secondary | ICD-10-CM | POA: Diagnosis not present

## 2018-02-19 DIAGNOSIS — J301 Allergic rhinitis due to pollen: Secondary | ICD-10-CM | POA: Diagnosis not present

## 2018-02-26 DIAGNOSIS — J301 Allergic rhinitis due to pollen: Secondary | ICD-10-CM | POA: Diagnosis not present

## 2018-03-02 DIAGNOSIS — M9901 Segmental and somatic dysfunction of cervical region: Secondary | ICD-10-CM | POA: Diagnosis not present

## 2018-03-02 DIAGNOSIS — J301 Allergic rhinitis due to pollen: Secondary | ICD-10-CM | POA: Diagnosis not present

## 2018-03-02 DIAGNOSIS — M62830 Muscle spasm of back: Secondary | ICD-10-CM | POA: Diagnosis not present

## 2018-03-02 DIAGNOSIS — M9902 Segmental and somatic dysfunction of thoracic region: Secondary | ICD-10-CM | POA: Diagnosis not present

## 2018-03-02 DIAGNOSIS — M5412 Radiculopathy, cervical region: Secondary | ICD-10-CM | POA: Diagnosis not present

## 2018-03-09 DIAGNOSIS — G4733 Obstructive sleep apnea (adult) (pediatric): Secondary | ICD-10-CM | POA: Diagnosis not present

## 2018-03-19 DIAGNOSIS — J301 Allergic rhinitis due to pollen: Secondary | ICD-10-CM | POA: Diagnosis not present

## 2018-03-24 DIAGNOSIS — M9902 Segmental and somatic dysfunction of thoracic region: Secondary | ICD-10-CM | POA: Diagnosis not present

## 2018-03-24 DIAGNOSIS — M5412 Radiculopathy, cervical region: Secondary | ICD-10-CM | POA: Diagnosis not present

## 2018-03-24 DIAGNOSIS — M9901 Segmental and somatic dysfunction of cervical region: Secondary | ICD-10-CM | POA: Diagnosis not present

## 2018-03-24 DIAGNOSIS — M62830 Muscle spasm of back: Secondary | ICD-10-CM | POA: Diagnosis not present

## 2018-04-13 DIAGNOSIS — J301 Allergic rhinitis due to pollen: Secondary | ICD-10-CM | POA: Diagnosis not present

## 2018-04-20 DIAGNOSIS — J301 Allergic rhinitis due to pollen: Secondary | ICD-10-CM | POA: Diagnosis not present

## 2018-04-27 DIAGNOSIS — J301 Allergic rhinitis due to pollen: Secondary | ICD-10-CM | POA: Diagnosis not present

## 2018-05-01 DIAGNOSIS — J301 Allergic rhinitis due to pollen: Secondary | ICD-10-CM | POA: Diagnosis not present

## 2018-05-04 DIAGNOSIS — J301 Allergic rhinitis due to pollen: Secondary | ICD-10-CM | POA: Diagnosis not present

## 2018-05-14 DIAGNOSIS — J301 Allergic rhinitis due to pollen: Secondary | ICD-10-CM | POA: Diagnosis not present

## 2018-05-14 DIAGNOSIS — M5412 Radiculopathy, cervical region: Secondary | ICD-10-CM | POA: Diagnosis not present

## 2018-05-14 DIAGNOSIS — M9902 Segmental and somatic dysfunction of thoracic region: Secondary | ICD-10-CM | POA: Diagnosis not present

## 2018-05-14 DIAGNOSIS — M62830 Muscle spasm of back: Secondary | ICD-10-CM | POA: Diagnosis not present

## 2018-05-14 DIAGNOSIS — M9901 Segmental and somatic dysfunction of cervical region: Secondary | ICD-10-CM | POA: Diagnosis not present

## 2018-05-18 DIAGNOSIS — E7800 Pure hypercholesterolemia, unspecified: Secondary | ICD-10-CM | POA: Diagnosis not present

## 2018-05-18 DIAGNOSIS — I10 Essential (primary) hypertension: Secondary | ICD-10-CM | POA: Diagnosis not present

## 2018-05-18 DIAGNOSIS — R5381 Other malaise: Secondary | ICD-10-CM | POA: Diagnosis not present

## 2018-05-18 DIAGNOSIS — R5383 Other fatigue: Secondary | ICD-10-CM | POA: Diagnosis not present

## 2018-05-21 DIAGNOSIS — J301 Allergic rhinitis due to pollen: Secondary | ICD-10-CM | POA: Diagnosis not present

## 2018-05-27 DIAGNOSIS — J301 Allergic rhinitis due to pollen: Secondary | ICD-10-CM | POA: Diagnosis not present

## 2018-06-08 DIAGNOSIS — G4733 Obstructive sleep apnea (adult) (pediatric): Secondary | ICD-10-CM | POA: Diagnosis not present

## 2018-06-08 DIAGNOSIS — J301 Allergic rhinitis due to pollen: Secondary | ICD-10-CM | POA: Diagnosis not present

## 2018-06-11 DIAGNOSIS — M9901 Segmental and somatic dysfunction of cervical region: Secondary | ICD-10-CM | POA: Diagnosis not present

## 2018-06-11 DIAGNOSIS — M9902 Segmental and somatic dysfunction of thoracic region: Secondary | ICD-10-CM | POA: Diagnosis not present

## 2018-06-11 DIAGNOSIS — M5412 Radiculopathy, cervical region: Secondary | ICD-10-CM | POA: Diagnosis not present

## 2018-06-11 DIAGNOSIS — M62830 Muscle spasm of back: Secondary | ICD-10-CM | POA: Diagnosis not present

## 2018-06-17 DIAGNOSIS — J301 Allergic rhinitis due to pollen: Secondary | ICD-10-CM | POA: Diagnosis not present

## 2018-06-25 DIAGNOSIS — J301 Allergic rhinitis due to pollen: Secondary | ICD-10-CM | POA: Diagnosis not present

## 2018-06-29 DIAGNOSIS — I10 Essential (primary) hypertension: Secondary | ICD-10-CM | POA: Diagnosis not present

## 2018-07-09 DIAGNOSIS — M5412 Radiculopathy, cervical region: Secondary | ICD-10-CM | POA: Diagnosis not present

## 2018-07-09 DIAGNOSIS — M9901 Segmental and somatic dysfunction of cervical region: Secondary | ICD-10-CM | POA: Diagnosis not present

## 2018-07-09 DIAGNOSIS — M62830 Muscle spasm of back: Secondary | ICD-10-CM | POA: Diagnosis not present

## 2018-07-09 DIAGNOSIS — M9902 Segmental and somatic dysfunction of thoracic region: Secondary | ICD-10-CM | POA: Diagnosis not present

## 2018-07-27 DIAGNOSIS — J301 Allergic rhinitis due to pollen: Secondary | ICD-10-CM | POA: Diagnosis not present

## 2018-07-30 DIAGNOSIS — J301 Allergic rhinitis due to pollen: Secondary | ICD-10-CM | POA: Diagnosis not present

## 2018-08-06 DIAGNOSIS — J301 Allergic rhinitis due to pollen: Secondary | ICD-10-CM | POA: Diagnosis not present

## 2018-08-13 DIAGNOSIS — M5412 Radiculopathy, cervical region: Secondary | ICD-10-CM | POA: Diagnosis not present

## 2018-08-13 DIAGNOSIS — M9901 Segmental and somatic dysfunction of cervical region: Secondary | ICD-10-CM | POA: Diagnosis not present

## 2018-08-13 DIAGNOSIS — M62830 Muscle spasm of back: Secondary | ICD-10-CM | POA: Diagnosis not present

## 2018-08-13 DIAGNOSIS — M9902 Segmental and somatic dysfunction of thoracic region: Secondary | ICD-10-CM | POA: Diagnosis not present

## 2018-08-13 DIAGNOSIS — J301 Allergic rhinitis due to pollen: Secondary | ICD-10-CM | POA: Diagnosis not present

## 2018-08-20 DIAGNOSIS — J301 Allergic rhinitis due to pollen: Secondary | ICD-10-CM | POA: Diagnosis not present

## 2018-08-27 DIAGNOSIS — J301 Allergic rhinitis due to pollen: Secondary | ICD-10-CM | POA: Diagnosis not present

## 2018-09-03 DIAGNOSIS — J301 Allergic rhinitis due to pollen: Secondary | ICD-10-CM | POA: Diagnosis not present

## 2018-09-07 DIAGNOSIS — G4733 Obstructive sleep apnea (adult) (pediatric): Secondary | ICD-10-CM | POA: Diagnosis not present

## 2018-09-10 DIAGNOSIS — M62830 Muscle spasm of back: Secondary | ICD-10-CM | POA: Diagnosis not present

## 2018-09-10 DIAGNOSIS — J301 Allergic rhinitis due to pollen: Secondary | ICD-10-CM | POA: Diagnosis not present

## 2018-09-10 DIAGNOSIS — M9901 Segmental and somatic dysfunction of cervical region: Secondary | ICD-10-CM | POA: Diagnosis not present

## 2018-09-10 DIAGNOSIS — M5412 Radiculopathy, cervical region: Secondary | ICD-10-CM | POA: Diagnosis not present

## 2018-09-10 DIAGNOSIS — M9902 Segmental and somatic dysfunction of thoracic region: Secondary | ICD-10-CM | POA: Diagnosis not present

## 2018-09-14 DIAGNOSIS — J309 Allergic rhinitis, unspecified: Secondary | ICD-10-CM | POA: Diagnosis not present

## 2018-09-14 DIAGNOSIS — G4733 Obstructive sleep apnea (adult) (pediatric): Secondary | ICD-10-CM | POA: Diagnosis not present

## 2018-09-17 DIAGNOSIS — J301 Allergic rhinitis due to pollen: Secondary | ICD-10-CM | POA: Diagnosis not present

## 2018-09-24 DIAGNOSIS — J301 Allergic rhinitis due to pollen: Secondary | ICD-10-CM | POA: Diagnosis not present

## 2018-10-08 DIAGNOSIS — M9902 Segmental and somatic dysfunction of thoracic region: Secondary | ICD-10-CM | POA: Diagnosis not present

## 2018-10-08 DIAGNOSIS — M9901 Segmental and somatic dysfunction of cervical region: Secondary | ICD-10-CM | POA: Diagnosis not present

## 2018-10-08 DIAGNOSIS — M5412 Radiculopathy, cervical region: Secondary | ICD-10-CM | POA: Diagnosis not present

## 2018-10-08 DIAGNOSIS — M62830 Muscle spasm of back: Secondary | ICD-10-CM | POA: Diagnosis not present

## 2018-10-22 DIAGNOSIS — J301 Allergic rhinitis due to pollen: Secondary | ICD-10-CM | POA: Diagnosis not present

## 2018-10-23 DIAGNOSIS — J301 Allergic rhinitis due to pollen: Secondary | ICD-10-CM | POA: Diagnosis not present

## 2018-11-05 DIAGNOSIS — M9902 Segmental and somatic dysfunction of thoracic region: Secondary | ICD-10-CM | POA: Diagnosis not present

## 2018-11-05 DIAGNOSIS — M5412 Radiculopathy, cervical region: Secondary | ICD-10-CM | POA: Diagnosis not present

## 2018-11-05 DIAGNOSIS — M9901 Segmental and somatic dysfunction of cervical region: Secondary | ICD-10-CM | POA: Diagnosis not present

## 2018-11-05 DIAGNOSIS — M62830 Muscle spasm of back: Secondary | ICD-10-CM | POA: Diagnosis not present

## 2018-11-05 DIAGNOSIS — J301 Allergic rhinitis due to pollen: Secondary | ICD-10-CM | POA: Diagnosis not present

## 2018-11-06 ENCOUNTER — Other Ambulatory Visit: Payer: Self-pay

## 2018-11-06 DIAGNOSIS — R6889 Other general symptoms and signs: Secondary | ICD-10-CM | POA: Diagnosis not present

## 2018-11-06 DIAGNOSIS — Z20822 Contact with and (suspected) exposure to covid-19: Secondary | ICD-10-CM

## 2018-11-08 LAB — NOVEL CORONAVIRUS, NAA: SARS-CoV-2, NAA: NOT DETECTED

## 2018-11-12 DIAGNOSIS — J301 Allergic rhinitis due to pollen: Secondary | ICD-10-CM | POA: Diagnosis not present

## 2018-12-03 DIAGNOSIS — M62830 Muscle spasm of back: Secondary | ICD-10-CM | POA: Diagnosis not present

## 2018-12-03 DIAGNOSIS — M5412 Radiculopathy, cervical region: Secondary | ICD-10-CM | POA: Diagnosis not present

## 2018-12-03 DIAGNOSIS — M9902 Segmental and somatic dysfunction of thoracic region: Secondary | ICD-10-CM | POA: Diagnosis not present

## 2018-12-03 DIAGNOSIS — M9901 Segmental and somatic dysfunction of cervical region: Secondary | ICD-10-CM | POA: Diagnosis not present

## 2018-12-03 DIAGNOSIS — J301 Allergic rhinitis due to pollen: Secondary | ICD-10-CM | POA: Diagnosis not present

## 2018-12-09 DIAGNOSIS — G4733 Obstructive sleep apnea (adult) (pediatric): Secondary | ICD-10-CM | POA: Diagnosis not present

## 2018-12-10 DIAGNOSIS — C4491 Basal cell carcinoma of skin, unspecified: Secondary | ICD-10-CM

## 2018-12-10 DIAGNOSIS — C44619 Basal cell carcinoma of skin of left upper limb, including shoulder: Secondary | ICD-10-CM | POA: Diagnosis not present

## 2018-12-10 DIAGNOSIS — D225 Melanocytic nevi of trunk: Secondary | ICD-10-CM | POA: Diagnosis not present

## 2018-12-10 DIAGNOSIS — Z86018 Personal history of other benign neoplasm: Secondary | ICD-10-CM | POA: Diagnosis not present

## 2018-12-10 DIAGNOSIS — L738 Other specified follicular disorders: Secondary | ICD-10-CM | POA: Diagnosis not present

## 2018-12-10 DIAGNOSIS — J301 Allergic rhinitis due to pollen: Secondary | ICD-10-CM | POA: Diagnosis not present

## 2018-12-10 DIAGNOSIS — D485 Neoplasm of uncertain behavior of skin: Secondary | ICD-10-CM | POA: Diagnosis not present

## 2018-12-10 DIAGNOSIS — Z1283 Encounter for screening for malignant neoplasm of skin: Secondary | ICD-10-CM | POA: Diagnosis not present

## 2018-12-10 HISTORY — DX: Basal cell carcinoma of skin, unspecified: C44.91

## 2018-12-24 DIAGNOSIS — J301 Allergic rhinitis due to pollen: Secondary | ICD-10-CM | POA: Diagnosis not present

## 2018-12-30 DIAGNOSIS — L239 Allergic contact dermatitis, unspecified cause: Secondary | ICD-10-CM | POA: Diagnosis not present

## 2018-12-30 DIAGNOSIS — C44619 Basal cell carcinoma of skin of left upper limb, including shoulder: Secondary | ICD-10-CM | POA: Diagnosis not present

## 2018-12-31 DIAGNOSIS — M9902 Segmental and somatic dysfunction of thoracic region: Secondary | ICD-10-CM | POA: Diagnosis not present

## 2018-12-31 DIAGNOSIS — M9901 Segmental and somatic dysfunction of cervical region: Secondary | ICD-10-CM | POA: Diagnosis not present

## 2018-12-31 DIAGNOSIS — M62830 Muscle spasm of back: Secondary | ICD-10-CM | POA: Diagnosis not present

## 2018-12-31 DIAGNOSIS — M5412 Radiculopathy, cervical region: Secondary | ICD-10-CM | POA: Diagnosis not present

## 2019-01-19 DIAGNOSIS — J301 Allergic rhinitis due to pollen: Secondary | ICD-10-CM | POA: Diagnosis not present

## 2019-01-21 ENCOUNTER — Other Ambulatory Visit: Payer: Self-pay

## 2019-01-21 DIAGNOSIS — Z20822 Contact with and (suspected) exposure to covid-19: Secondary | ICD-10-CM

## 2019-01-21 DIAGNOSIS — Z20828 Contact with and (suspected) exposure to other viral communicable diseases: Secondary | ICD-10-CM | POA: Diagnosis not present

## 2019-01-23 LAB — NOVEL CORONAVIRUS, NAA: SARS-CoV-2, NAA: NOT DETECTED

## 2019-02-04 DIAGNOSIS — M62830 Muscle spasm of back: Secondary | ICD-10-CM | POA: Diagnosis not present

## 2019-02-04 DIAGNOSIS — M5412 Radiculopathy, cervical region: Secondary | ICD-10-CM | POA: Diagnosis not present

## 2019-02-04 DIAGNOSIS — M9901 Segmental and somatic dysfunction of cervical region: Secondary | ICD-10-CM | POA: Diagnosis not present

## 2019-02-04 DIAGNOSIS — M9902 Segmental and somatic dysfunction of thoracic region: Secondary | ICD-10-CM | POA: Diagnosis not present

## 2019-02-10 ENCOUNTER — Other Ambulatory Visit: Payer: Self-pay

## 2019-02-10 DIAGNOSIS — Z20822 Contact with and (suspected) exposure to covid-19: Secondary | ICD-10-CM

## 2019-02-10 DIAGNOSIS — Z20828 Contact with and (suspected) exposure to other viral communicable diseases: Secondary | ICD-10-CM | POA: Diagnosis not present

## 2019-02-11 LAB — NOVEL CORONAVIRUS, NAA: SARS-CoV-2, NAA: NOT DETECTED

## 2019-02-12 DIAGNOSIS — Z20828 Contact with and (suspected) exposure to other viral communicable diseases: Secondary | ICD-10-CM | POA: Diagnosis not present

## 2019-02-26 DIAGNOSIS — Z0000 Encounter for general adult medical examination without abnormal findings: Secondary | ICD-10-CM | POA: Diagnosis not present

## 2019-02-26 DIAGNOSIS — I10 Essential (primary) hypertension: Secondary | ICD-10-CM | POA: Diagnosis not present

## 2019-02-26 DIAGNOSIS — R05 Cough: Secondary | ICD-10-CM | POA: Diagnosis not present

## 2019-02-26 DIAGNOSIS — U071 COVID-19: Secondary | ICD-10-CM | POA: Diagnosis not present

## 2019-02-26 DIAGNOSIS — Z79899 Other long term (current) drug therapy: Secondary | ICD-10-CM | POA: Diagnosis not present

## 2019-02-26 DIAGNOSIS — R972 Elevated prostate specific antigen [PSA]: Secondary | ICD-10-CM | POA: Diagnosis not present

## 2019-02-26 DIAGNOSIS — E7800 Pure hypercholesterolemia, unspecified: Secondary | ICD-10-CM | POA: Diagnosis not present

## 2019-02-26 DIAGNOSIS — R7309 Other abnormal glucose: Secondary | ICD-10-CM | POA: Diagnosis not present

## 2019-03-03 DIAGNOSIS — M9902 Segmental and somatic dysfunction of thoracic region: Secondary | ICD-10-CM | POA: Diagnosis not present

## 2019-03-03 DIAGNOSIS — M5412 Radiculopathy, cervical region: Secondary | ICD-10-CM | POA: Diagnosis not present

## 2019-03-03 DIAGNOSIS — M9901 Segmental and somatic dysfunction of cervical region: Secondary | ICD-10-CM | POA: Diagnosis not present

## 2019-03-03 DIAGNOSIS — M62830 Muscle spasm of back: Secondary | ICD-10-CM | POA: Diagnosis not present

## 2019-03-11 DIAGNOSIS — G4733 Obstructive sleep apnea (adult) (pediatric): Secondary | ICD-10-CM | POA: Diagnosis not present

## 2019-05-31 DIAGNOSIS — R1084 Generalized abdominal pain: Secondary | ICD-10-CM | POA: Diagnosis not present

## 2019-05-31 DIAGNOSIS — K5792 Diverticulitis of intestine, part unspecified, without perforation or abscess without bleeding: Secondary | ICD-10-CM | POA: Diagnosis not present

## 2019-05-31 DIAGNOSIS — R350 Frequency of micturition: Secondary | ICD-10-CM | POA: Diagnosis not present

## 2019-05-31 DIAGNOSIS — R109 Unspecified abdominal pain: Secondary | ICD-10-CM | POA: Diagnosis not present

## 2019-06-03 ENCOUNTER — Other Ambulatory Visit: Payer: Self-pay

## 2019-06-03 ENCOUNTER — Other Ambulatory Visit (HOSPITAL_COMMUNITY): Payer: Self-pay | Admitting: Physician Assistant

## 2019-06-03 ENCOUNTER — Other Ambulatory Visit: Payer: Self-pay | Admitting: Physician Assistant

## 2019-06-03 ENCOUNTER — Ambulatory Visit
Admission: RE | Admit: 2019-06-03 | Discharge: 2019-06-03 | Disposition: A | Discharge status: Home / Self Care | Payer: 59 | Source: Ambulatory Visit | Attending: Physician Assistant | Admitting: Physician Assistant

## 2019-06-03 DIAGNOSIS — R1032 Left lower quadrant pain: Secondary | ICD-10-CM

## 2019-06-03 DIAGNOSIS — R109 Unspecified abdominal pain: Secondary | ICD-10-CM | POA: Diagnosis not present

## 2019-06-03 DIAGNOSIS — M545 Low back pain: Secondary | ICD-10-CM | POA: Diagnosis not present

## 2019-06-09 DIAGNOSIS — G4733 Obstructive sleep apnea (adult) (pediatric): Secondary | ICD-10-CM | POA: Diagnosis not present

## 2019-06-30 ENCOUNTER — Other Ambulatory Visit: Payer: Self-pay

## 2019-06-30 ENCOUNTER — Ambulatory Visit (INDEPENDENT_AMBULATORY_CARE_PROVIDER_SITE_OTHER): Payer: 59 | Admitting: Dermatology

## 2019-06-30 ENCOUNTER — Encounter: Payer: Self-pay | Admitting: Dermatology

## 2019-06-30 DIAGNOSIS — L578 Other skin changes due to chronic exposure to nonionizing radiation: Secondary | ICD-10-CM

## 2019-06-30 DIAGNOSIS — Z85828 Personal history of other malignant neoplasm of skin: Secondary | ICD-10-CM | POA: Diagnosis not present

## 2019-06-30 DIAGNOSIS — D225 Melanocytic nevi of trunk: Secondary | ICD-10-CM

## 2019-06-30 DIAGNOSIS — L814 Other melanin hyperpigmentation: Secondary | ICD-10-CM | POA: Diagnosis not present

## 2019-06-30 DIAGNOSIS — Z1283 Encounter for screening for malignant neoplasm of skin: Secondary | ICD-10-CM

## 2019-06-30 DIAGNOSIS — D2372 Other benign neoplasm of skin of left lower limb, including hip: Secondary | ICD-10-CM

## 2019-06-30 DIAGNOSIS — D239 Other benign neoplasm of skin, unspecified: Secondary | ICD-10-CM

## 2019-06-30 DIAGNOSIS — D229 Melanocytic nevi, unspecified: Secondary | ICD-10-CM

## 2019-06-30 DIAGNOSIS — Z86018 Personal history of other benign neoplasm: Secondary | ICD-10-CM

## 2019-06-30 DIAGNOSIS — B353 Tinea pedis: Secondary | ICD-10-CM

## 2019-06-30 DIAGNOSIS — D485 Neoplasm of uncertain behavior of skin: Secondary | ICD-10-CM

## 2019-06-30 MED ORDER — CICLOPIROX OLAMINE 0.77 % EX CREA: TOPICAL_CREAM | CUTANEOUS | 5 refills | Status: AC

## 2019-06-30 NOTE — Progress Notes (Signed)
Follow-Up Visit   Subjective  Dalton Huff is a 44 y.o. male who presents for the following: Annual Exam (Nothing new or changing that pt is aware of. Hx BCC, DN. ).  The following portions of the chart were reviewed this encounter and updated as appropriate: Tobacco  Allergies  Meds  Problems  Med Hx  Surg Hx  Fam Hx      Review of Systems: No other skin or systemic complaints.  Objective  Well appearing patient in no apparent distress; mood and affect are within normal limits.  A full examination was performed including scalp, head, eyes, ears, nose, lips, neck, chest, axillae, abdomen, back, buttocks, bilateral upper extremities, bilateral lower extremities, hands, feet, fingers, toes, fingernails, and toenails. All findings within normal limits unless otherwise noted below.  Objective  Trunk, face, ext: Diffuse scaly erythematous macules with underlying dyspigmentation.   Objective  Left shin: Firm pink/brown papulenodule with dimple sign.   Objective  Left posterior shoulder: Well healed scar with no evidence of recurrence, slight hypertrophic scar.  Objective  Left Spinal Lower Back, Mid Chest: Scar with no evidence of recurrence.   Objective  Trunk, ext: Scattered tan macules.   Objective  Right chest: 0.3cm irregular dark brown papule       Objective  Trunk, ext: Tan-brown and/or pink-flesh-colored symmetric macules and papules.   Left Lower Back: 0.8 x 0.3cm medium to dark brown macule.  Will photo and recheck.   Images    Objective  B/L feet, in between toes: Scaling and maceration web spaces and over distal and lateral soles.   Assessment & Plan  Actinic skin damage Trunk, face, ext  Recommend daily broad spectrum sunscreen SPF 30+ to sun-exposed areas, reapply every 2 hours as needed. Call for new or changing lesions.   Dermatofibroma Left shin  Benign, observe.    History of basal cell carcinoma (BCC) Left posterior  shoulder  Clear. Observe for recurrence. Call clinic for new or changing lesions.  Recommend regular skin exams, daily broad-spectrum spf 30+ sunscreen use, and photoprotection.     History of dysplastic nevus Left Spinal Lower Back, Mid Chest  Clear. Observe for recurrence. Call clinic for new or changing lesions.  Recommend regular skin exams, daily broad-spectrum spf 30+ sunscreen use, and photoprotection.     Lentigines Trunk, ext  Benign, observe.    Neoplasm of uncertain behavior of skin Right chest  Epidermal / dermal shaving  Lesion length (cm):  0.3 Lesion width (cm):  0.3 Margin per side (cm):  0.1 Total excision diameter (cm):  0.5 Informed consent: discussed and consent obtained   Timeout: patient name, date of birth, surgical site, and procedure verified   Anesthesia: the lesion was anesthetized in a standard fashion   Anesthetic:  1% lidocaine w/ epinephrine 1-100,000 buffered w/ 8.4% NaHCO3 Instrument used: flexible razor blade   Outcome: patient tolerated procedure well   Post-procedure details: wound care instructions given   Additional details:  Petrolatum and pressure bandage applied  Specimen 1 - Surgical pathology Differential Diagnosis: r/o Atypia vs SK Check Margins: No 0.3cm irregular dark brown papule  Nevus (2) Trunk, ext; Left Lower Back  Benign-appearing.  Observation.  Call clinic for new or changing moles.  Recommend daily use of broad spectrum spf 30+ sunscreen to sun-exposed areas.    Screening exam for skin cancer Scalp  Recommend daily broad spectrum sunscreen SPF 30+ to sun-exposed areas, reapply every 2 hours as needed. Call for new or  changing lesions.   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 Not responding to antifungal powder or spray.   Start ciclopirox cream BID x 2-3 weeks to feet, in between toes. Once clear, can use 1-2x per week to prevent recurrence or can prevent recurrence with powder in socks  or spray to feet and between toes.   ciclopirox (LOPROX) 0.77 % cream - B/L feet, in between toes  Return in about 6 months (around 12/31/2019) for TBSE.   Graciella Belton, RMA, am acting as scribe for Forest Gleason, MD .

## 2019-06-30 NOTE — Patient Instructions (Addendum)
Shave Excision Benign Lesion Wound Care Instructions  . Leave the original bandage on for 24 hours if possible.  If the bandage becomes soaked or soiled before that time, it is OK to remove it and examine the wound.  A small amount of post-operative bleeding is normal.  If excessive bleeding occurs, remove the bandage, place gauze over the site and apply continuous pressure (no peeking) over the area for 20-30 minutes.  If this does not stop the bleeding, try again for 40 minutes.  If this does not work, please call our clinic as soon as possible (even if after-hours).    . Twice a day, cleanse the wound with soap and water.  If a thick crust develops you may use a Q-tip dipped into dilute hydrogen peroxide (mix 1:1 with water) to dissolve it.  Hydrogen peroxide can slow the healing process, so use it only as needed.  After washing, apply Vaseline jelly or Polysporin ointment.  For best healing, the wound should be covered with a layer of ointment at all times.  This may mean re-applying the ointment several times a day.  For open wounds, continue until it has healed.    . If you have any swelling, keep the area elevated.  . Some redness, tenderness and white or yellow material in the wound is normal healing.  If the area becomes very sore and red, or develops a thick yellow-green material (pus), it may be infected; please notify us.    . Wound healing continues for up to one year following surgery.  It is not unusual to experience pain in the scar from time to time during the interval.  If the pain becomes severe or the scar thickens, you should notify the office.  A slight amount of redness in a scar is expected for the first six months.  After six months, the redness subsides and the scar will soften and fade.  The color difference becomes less noticeable with time.  If there are any problems, return for a post-op surgery check at your earliest convenience.  . Please call our office for any questions  or concerns.  Recommend daily broad spectrum sunscreen SPF 30+ to sun-exposed areas, reapply every 2 hours as needed. Call for new or changing lesions.

## 2019-07-06 ENCOUNTER — Telehealth: Payer: Self-pay

## 2019-07-06 NOTE — Telephone Encounter (Signed)
Patient's wife advised bx benign, nothing to do.

## 2019-07-19 DIAGNOSIS — R197 Diarrhea, unspecified: Secondary | ICD-10-CM | POA: Diagnosis not present

## 2019-07-19 DIAGNOSIS — Z8719 Personal history of other diseases of the digestive system: Secondary | ICD-10-CM | POA: Diagnosis not present

## 2019-07-29 DIAGNOSIS — K760 Fatty (change of) liver, not elsewhere classified: Secondary | ICD-10-CM | POA: Diagnosis not present

## 2019-07-29 DIAGNOSIS — E7800 Pure hypercholesterolemia, unspecified: Secondary | ICD-10-CM | POA: Diagnosis not present

## 2019-07-29 DIAGNOSIS — I10 Essential (primary) hypertension: Secondary | ICD-10-CM | POA: Diagnosis not present

## 2019-07-29 DIAGNOSIS — Z8719 Personal history of other diseases of the digestive system: Secondary | ICD-10-CM | POA: Diagnosis not present

## 2019-08-09 DIAGNOSIS — R109 Unspecified abdominal pain: Secondary | ICD-10-CM | POA: Diagnosis not present

## 2019-08-09 DIAGNOSIS — R197 Diarrhea, unspecified: Secondary | ICD-10-CM | POA: Diagnosis not present

## 2019-08-09 DIAGNOSIS — I10 Essential (primary) hypertension: Secondary | ICD-10-CM | POA: Diagnosis not present

## 2019-08-26 DIAGNOSIS — Z01812 Encounter for preprocedural laboratory examination: Secondary | ICD-10-CM | POA: Diagnosis not present

## 2019-08-27 DIAGNOSIS — E7800 Pure hypercholesterolemia, unspecified: Secondary | ICD-10-CM | POA: Diagnosis not present

## 2019-08-27 DIAGNOSIS — I10 Essential (primary) hypertension: Secondary | ICD-10-CM | POA: Diagnosis not present

## 2019-08-31 DIAGNOSIS — Z09 Encounter for follow-up examination after completed treatment for conditions other than malignant neoplasm: Secondary | ICD-10-CM | POA: Diagnosis not present

## 2019-08-31 DIAGNOSIS — K5730 Diverticulosis of large intestine without perforation or abscess without bleeding: Secondary | ICD-10-CM | POA: Diagnosis not present

## 2019-08-31 DIAGNOSIS — K640 First degree hemorrhoids: Secondary | ICD-10-CM | POA: Diagnosis not present

## 2019-08-31 DIAGNOSIS — K5732 Diverticulitis of large intestine without perforation or abscess without bleeding: Secondary | ICD-10-CM | POA: Diagnosis not present

## 2019-09-02 DIAGNOSIS — M5412 Radiculopathy, cervical region: Secondary | ICD-10-CM | POA: Diagnosis not present

## 2019-09-02 DIAGNOSIS — M9901 Segmental and somatic dysfunction of cervical region: Secondary | ICD-10-CM | POA: Diagnosis not present

## 2019-09-02 DIAGNOSIS — M62830 Muscle spasm of back: Secondary | ICD-10-CM | POA: Diagnosis not present

## 2019-09-02 DIAGNOSIS — M9902 Segmental and somatic dysfunction of thoracic region: Secondary | ICD-10-CM | POA: Diagnosis not present

## 2019-09-08 DIAGNOSIS — G4733 Obstructive sleep apnea (adult) (pediatric): Secondary | ICD-10-CM | POA: Diagnosis not present

## 2019-09-15 DIAGNOSIS — G4733 Obstructive sleep apnea (adult) (pediatric): Secondary | ICD-10-CM | POA: Diagnosis not present

## 2019-09-15 DIAGNOSIS — J309 Allergic rhinitis, unspecified: Secondary | ICD-10-CM | POA: Diagnosis not present

## 2019-09-30 DIAGNOSIS — M9901 Segmental and somatic dysfunction of cervical region: Secondary | ICD-10-CM | POA: Diagnosis not present

## 2019-09-30 DIAGNOSIS — M62830 Muscle spasm of back: Secondary | ICD-10-CM | POA: Diagnosis not present

## 2019-09-30 DIAGNOSIS — M5412 Radiculopathy, cervical region: Secondary | ICD-10-CM | POA: Diagnosis not present

## 2019-09-30 DIAGNOSIS — M9902 Segmental and somatic dysfunction of thoracic region: Secondary | ICD-10-CM | POA: Diagnosis not present

## 2019-10-28 DIAGNOSIS — M9901 Segmental and somatic dysfunction of cervical region: Secondary | ICD-10-CM | POA: Diagnosis not present

## 2019-10-28 DIAGNOSIS — M9902 Segmental and somatic dysfunction of thoracic region: Secondary | ICD-10-CM | POA: Diagnosis not present

## 2019-10-28 DIAGNOSIS — M62830 Muscle spasm of back: Secondary | ICD-10-CM | POA: Diagnosis not present

## 2019-10-28 DIAGNOSIS — M5412 Radiculopathy, cervical region: Secondary | ICD-10-CM | POA: Diagnosis not present

## 2019-11-19 DIAGNOSIS — E7800 Pure hypercholesterolemia, unspecified: Secondary | ICD-10-CM | POA: Diagnosis not present

## 2019-11-19 DIAGNOSIS — Z125 Encounter for screening for malignant neoplasm of prostate: Secondary | ICD-10-CM | POA: Diagnosis not present

## 2019-11-19 DIAGNOSIS — I10 Essential (primary) hypertension: Secondary | ICD-10-CM | POA: Diagnosis not present

## 2019-11-19 DIAGNOSIS — G4733 Obstructive sleep apnea (adult) (pediatric): Secondary | ICD-10-CM | POA: Diagnosis not present

## 2019-11-25 DIAGNOSIS — M62830 Muscle spasm of back: Secondary | ICD-10-CM | POA: Diagnosis not present

## 2019-11-25 DIAGNOSIS — M9902 Segmental and somatic dysfunction of thoracic region: Secondary | ICD-10-CM | POA: Diagnosis not present

## 2019-11-25 DIAGNOSIS — M9901 Segmental and somatic dysfunction of cervical region: Secondary | ICD-10-CM | POA: Diagnosis not present

## 2019-11-25 DIAGNOSIS — M5412 Radiculopathy, cervical region: Secondary | ICD-10-CM | POA: Diagnosis not present

## 2019-12-08 DIAGNOSIS — G4733 Obstructive sleep apnea (adult) (pediatric): Secondary | ICD-10-CM | POA: Diagnosis not present

## 2019-12-23 DIAGNOSIS — M62830 Muscle spasm of back: Secondary | ICD-10-CM | POA: Diagnosis not present

## 2019-12-23 DIAGNOSIS — M9902 Segmental and somatic dysfunction of thoracic region: Secondary | ICD-10-CM | POA: Diagnosis not present

## 2019-12-23 DIAGNOSIS — M9901 Segmental and somatic dysfunction of cervical region: Secondary | ICD-10-CM | POA: Diagnosis not present

## 2019-12-23 DIAGNOSIS — M5412 Radiculopathy, cervical region: Secondary | ICD-10-CM | POA: Diagnosis not present

## 2019-12-30 ENCOUNTER — Encounter: Payer: 59 | Admitting: Dermatology

## 2019-12-31 DIAGNOSIS — U071 COVID-19: Secondary | ICD-10-CM | POA: Diagnosis not present

## 2019-12-31 DIAGNOSIS — J208 Acute bronchitis due to other specified organisms: Secondary | ICD-10-CM | POA: Diagnosis not present

## 2020-01-18 ENCOUNTER — Ambulatory Visit (INDEPENDENT_AMBULATORY_CARE_PROVIDER_SITE_OTHER): Payer: 59 | Admitting: Dermatology

## 2020-01-18 ENCOUNTER — Other Ambulatory Visit: Payer: Self-pay

## 2020-01-18 DIAGNOSIS — D239 Other benign neoplasm of skin, unspecified: Secondary | ICD-10-CM

## 2020-01-18 DIAGNOSIS — D225 Melanocytic nevi of trunk: Secondary | ICD-10-CM | POA: Diagnosis not present

## 2020-01-18 DIAGNOSIS — D1800 Hemangioma unspecified site: Secondary | ICD-10-CM | POA: Diagnosis not present

## 2020-01-18 DIAGNOSIS — D2372 Other benign neoplasm of skin of left lower limb, including hip: Secondary | ICD-10-CM | POA: Diagnosis not present

## 2020-01-18 DIAGNOSIS — L821 Other seborrheic keratosis: Secondary | ICD-10-CM

## 2020-01-18 DIAGNOSIS — L814 Other melanin hyperpigmentation: Secondary | ICD-10-CM | POA: Diagnosis not present

## 2020-01-18 DIAGNOSIS — L74513 Primary focal hyperhidrosis, soles: Secondary | ICD-10-CM

## 2020-01-18 DIAGNOSIS — D485 Neoplasm of uncertain behavior of skin: Secondary | ICD-10-CM

## 2020-01-18 DIAGNOSIS — L74519 Primary focal hyperhidrosis, unspecified: Secondary | ICD-10-CM

## 2020-01-18 DIAGNOSIS — B353 Tinea pedis: Secondary | ICD-10-CM | POA: Diagnosis not present

## 2020-01-18 DIAGNOSIS — D229 Melanocytic nevi, unspecified: Secondary | ICD-10-CM

## 2020-01-18 DIAGNOSIS — Z1283 Encounter for screening for malignant neoplasm of skin: Secondary | ICD-10-CM | POA: Diagnosis not present

## 2020-01-18 DIAGNOSIS — L578 Other skin changes due to chronic exposure to nonionizing radiation: Secondary | ICD-10-CM

## 2020-01-18 DIAGNOSIS — Z85828 Personal history of other malignant neoplasm of skin: Secondary | ICD-10-CM

## 2020-01-18 DIAGNOSIS — Z86018 Personal history of other benign neoplasm: Secondary | ICD-10-CM

## 2020-01-18 DIAGNOSIS — L820 Inflamed seborrheic keratosis: Secondary | ICD-10-CM | POA: Diagnosis not present

## 2020-01-18 HISTORY — DX: Other benign neoplasm of skin, unspecified: D23.9

## 2020-01-18 NOTE — Progress Notes (Signed)
Follow-Up Visit   Subjective  Dalton Huff is a 44 y.o. male who presents for the following: FBSE.  Patient here for full body skin exam and skin cancer screening. He has a h/o BCC, dysplastic nevi. Patient is not aware of anything new or changing today.  He does report significant sweating of his feet for many years that is bothersome.   The following portions of the chart were reviewed this encounter and updated as appropriate:  Tobacco  Allergies  Meds  Problems  Med Hx  Surg Hx  Fam Hx      Review of Systems:  No other skin or systemic complaints except as noted in HPI or Assessment and Plan.  Objective  Well appearing patient in no apparent distress; mood and affect are within normal limits.  A full examination was performed including scalp, head, eyes, ears, nose, lips, neck, chest, axillae, abdomen, back, buttocks, bilateral upper extremities, bilateral lower extremities, hands, feet, fingers, toes, fingernails, and toenails. All findings within normal limits unless otherwise noted below.  Objective  Left shin: Firm pink/brown papule nodule with dimple sign.   Objective  bilateral feet: Scaling and maceration web spaces and over distal and lateral soles.   Objective  bilateral feet: Excessive sweating of feet  Objective  Right Mid Back: 0.6cm erythematous tan papule R/o ISK vs Atypia       Objective  Right Lateral Abdomen: 0.4cm medium to dark brown papule R/o Atypia        Assessment & Plan  Dermatofibroma Left shin  Benign-appearing.  Observation.  Call clinic for new or changing lesions.  Recommend daily use of broad spectrum spf 30+ sunscreen to sun-exposed areas.    Tinea pedis of both feet bilateral feet  Start over the counter terbinafine cream twice daily for 3 weeks.  If not improving can send in ciclopirox.   Primary focal hyperhidrosis bilateral feet  Chronic. Recommend trial of over the counter Carpe lotion.    Discussed botox and glycopyrrolate. If Carpe lotion does not offer sufficient help, will consider glycopyrrolate or botox.   Neoplasm of uncertain behavior of skin (2) Right Mid Back  Epidermal / dermal shaving  Lesion diameter (cm):  0.6 Informed consent: discussed and consent obtained   Timeout: patient name, date of birth, surgical site, and procedure verified   Patient was prepped and draped in usual sterile fashion: area prepped with isopropyl alcohol. Anesthesia: the lesion was anesthetized in a standard fashion   Anesthetic:  1% lidocaine w/ epinephrine 1-100,000 buffered w/ 8.4% NaHCO3 Instrument used: flexible razor blade   Hemostasis achieved with: aluminum chloride   Outcome: patient tolerated procedure well   Post-procedure details: wound care instructions given   Additional details:  Mupirocin and a bandage applied  Specimen 1 - Surgical pathology Differential Diagnosis:  Check Margins: No 0.6cm erythematous tan papule R/o ISK vs Atypia  Right Lateral Abdomen  Epidermal / dermal shaving  Lesion diameter (cm):  0.4 Informed consent: discussed and consent obtained   Timeout: patient name, date of birth, surgical site, and procedure verified   Patient was prepped and draped in usual sterile fashion: area prepped with isopropyl alcohol. Anesthesia: the lesion was anesthetized in a standard fashion   Anesthetic:  1% lidocaine w/ epinephrine 1-100,000 buffered w/ 8.4% NaHCO3 Instrument used: flexible razor blade   Hemostasis achieved with: aluminum chloride   Outcome: patient tolerated procedure well   Post-procedure details: wound care instructions given   Additional details:  Mupirocin  and a bandage applied  Specimen 2 - Surgical pathology Differential Diagnosis:  Check Margins: No 0.4cm medium to dark brown papule R/o Atypia   Lentigines - Scattered tan macules - Discussed due to sun exposure - Benign, observe - Call for any changes  Seborrheic  Keratoses - Stuck-on, waxy, tan-brown papules and plaques  - Discussed benign etiology and prognosis. - Observe - Call for any changes  Melanocytic Nevi - Tan-brown and/or pink-flesh-colored symmetric macules and papules - Benign appearing on exam today - Observation - Call clinic for new or changing moles - Recommend daily use of broad spectrum spf 30+ sunscreen to sun-exposed areas.   Hemangiomas - Red papules - Discussed benign nature - Observe - Call for any changes  Actinic Damage - diffuse scaly erythematous macules with underlying dyspigmentation - Recommend daily broad spectrum sunscreen SPF 30+ to sun-exposed areas, reapply every 2 hours as needed.  - Call for new or changing lesions.  Skin cancer screening performed today.  History of Basal Cell Carcinoma of the Skin - No evidence of recurrence today at left post shoulder with hypertrophic scar - Recommend regular full body skin exams - Recommend daily broad spectrum sunscreen SPF 30+ to sun-exposed areas, reapply every 2 hours as needed.  - Call if any new or changing lesions are noted between office visits  History of Dysplastic Nevi - No evidence of recurrence today at left spinal lower back, mid chest - Recommend regular full body skin exams - Recommend daily broad spectrum sunscreen SPF 30+ to sun-exposed areas, reapply every 2 hours as needed.  - Call if any new or changing lesions are noted between office visits  Return in about 6 months (around 07/18/2020) for TBSE.  Graciella Belton, RMA, am acting as scribe for Forest Gleason, MD .  Documentation: I have reviewed the above documentation for accuracy and completeness, and I agree with the above.  Forest Gleason, MD

## 2020-01-18 NOTE — Patient Instructions (Addendum)
Melanoma ABCDEs  Melanoma is the most dangerous type of skin cancer, and is the leading cause of death from skin disease.  You are more likely to develop melanoma if you:  Have light-colored skin, light-colored eyes, or red or blond hair  Spend a lot of time in the sun  Tan regularly, either outdoors or in a tanning bed  Have had blistering sunburns, especially during childhood  Have a close family member who has had a melanoma  Have atypical moles or large birthmarks  Early detection of melanoma is key since treatment is typically straightforward and cure rates are extremely high if we catch it early.   The first sign of melanoma is often a change in a mole or a new dark spot.  The ABCDE system is a way of remembering the signs of melanoma.  A for asymmetry:  The two halves do not match. B for border:  The edges of the growth are irregular. C for color:  A mixture of colors are present instead of an even brown color. D for diameter:  Melanomas are usually (but not always) greater than 99mm - the size of a pencil eraser. E for evolution:  The spot keeps changing in size, shape, and color.  Please check your skin once per month between visits. You can use a small mirror in front and a large mirror behind you to keep an eye on the back side or your body.   If you see any new or changing lesions before your next follow-up, please call to schedule a visit.  Please continue daily skin protection including broad spectrum sunscreen SPF 30+ to sun-exposed areas, reapplying every 2 hours as needed when you're outdoors.   Start over the counter terbinafine twice daily for 3 weeks to feet and in between toes.  If not improving can send in ciclopirox.   For excessive sweating at feet recommend over the counter Carpe lotion.  Wound Care Instructions  1. Cleanse wound gently with soap and water once a day then pat dry with clean gauze. Apply a thing coat of Petrolatum (petroleum jelly,  "Vaseline") over the wound (unless you have an allergy to this). We recommend that you use a new, sterile tube of Vaseline. Do not pick or remove scabs. Do not remove the yellow or white "healing tissue" from the base of the wound.  2. Cover the wound with fresh, clean, nonstick gauze and secure with paper tape. You may use Band-Aids in place of gauze and tape if the would is small enough, but would recommend trimming much of the tape off as there is often too much. Sometimes Band-Aids can irritate the skin.  3. You should call the office for your biopsy report after 1 week if you have not already been contacted.  4. If you experience any problems, such as abnormal amounts of bleeding, swelling, significant bruising, significant pain, or evidence of infection, please call the office immediately.  5. FOR ADULT SURGERY PATIENTS: If you need something for pain relief you may take 1 extra strength Tylenol (acetaminophen) AND 2 Ibuprofen (200mg  each) together every 4 hours as needed for pain. (do not take these if you are allergic to them or if you have a reason you should not take them.) Typically, you may only need pain medication for 1 to 3 days.     Recommend Nicotinamide 500mg  twice per day to lower risk of non-melanoma skin cancer by approximately 25%.

## 2020-01-19 ENCOUNTER — Encounter: Payer: Self-pay | Admitting: Dermatology

## 2020-01-25 ENCOUNTER — Telehealth: Payer: Self-pay

## 2020-01-25 NOTE — Telephone Encounter (Signed)
Patient called and informed of biopsy results, patient verbalized understanding.  

## 2020-01-25 NOTE — Progress Notes (Signed)
1. Skin , right mid back SEBORRHEIC KERATOSIS, IRRITATED   This is a benign growth or "wisdom spot". No additional treatment is needed.   2. 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 On the spectrum from normal mole to melanoma skin cancer, this is in between the two. - We need to recheck this area sometime in the next 6 months to be sure there is no evidence of the atypical mole coming back. If there is any color coming back, we would recommend repeating the biopsy to be sure the cells look normal.  - People who have a history of atypical moles do have a slightly increased risk of developing melanoma somewhere on the body, so a yearly full body skin exam by a dermatologist is recommended.  - Monthly self skin checks and daily sun protection are also recommended.  - Please call if you notice a dark spot coming back where this biopsy was taken.  - Please also call if you notice any new or changing spots anywhere else on the body before your follow-up visit.   MAs please call

## 2020-01-25 NOTE — Telephone Encounter (Signed)
-----   Message from Alfonso Patten, MD sent at 01/25/2020 10:36 AM EDT ----- 1. Skin , right mid back SEBORRHEIC KERATOSIS, IRRITATED   This is a benign growth or "wisdom spot". No additional treatment is needed.   2. 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 On the spectrum from normal mole to melanoma skin cancer, this is in between the two. - We need to recheck this area sometime in the next 6 months to be sure there is no evidence of the atypical mole coming back. If there is any color coming back, we would recommend repeating the biopsy to be sure the cells look normal.  - People who have a history of atypical moles do have a slightly increased risk of developing melanoma somewhere on the body, so a yearly full body skin exam by a dermatologist is recommended.  - Monthly self skin checks and daily sun protection are also recommended.  - Please call if you notice a dark spot coming back where this biopsy was taken.  - Please also call if you notice any new or changing spots anywhere else on the body before your follow-up visit.   MAs please call

## 2020-01-27 DIAGNOSIS — M9901 Segmental and somatic dysfunction of cervical region: Secondary | ICD-10-CM | POA: Diagnosis not present

## 2020-01-27 DIAGNOSIS — M62830 Muscle spasm of back: Secondary | ICD-10-CM | POA: Diagnosis not present

## 2020-01-27 DIAGNOSIS — M9902 Segmental and somatic dysfunction of thoracic region: Secondary | ICD-10-CM | POA: Diagnosis not present

## 2020-01-27 DIAGNOSIS — M5412 Radiculopathy, cervical region: Secondary | ICD-10-CM | POA: Diagnosis not present

## 2020-02-23 DIAGNOSIS — Z79899 Other long term (current) drug therapy: Secondary | ICD-10-CM | POA: Diagnosis not present

## 2020-02-23 DIAGNOSIS — I10 Essential (primary) hypertension: Secondary | ICD-10-CM | POA: Diagnosis not present

## 2020-02-23 DIAGNOSIS — Z125 Encounter for screening for malignant neoplasm of prostate: Secondary | ICD-10-CM | POA: Diagnosis not present

## 2020-02-23 DIAGNOSIS — E7800 Pure hypercholesterolemia, unspecified: Secondary | ICD-10-CM | POA: Diagnosis not present

## 2020-02-29 DIAGNOSIS — Z0000 Encounter for general adult medical examination without abnormal findings: Secondary | ICD-10-CM | POA: Diagnosis not present

## 2020-02-29 DIAGNOSIS — G4733 Obstructive sleep apnea (adult) (pediatric): Secondary | ICD-10-CM | POA: Diagnosis not present

## 2020-02-29 DIAGNOSIS — I10 Essential (primary) hypertension: Secondary | ICD-10-CM | POA: Diagnosis not present

## 2020-02-29 DIAGNOSIS — E7800 Pure hypercholesterolemia, unspecified: Secondary | ICD-10-CM | POA: Diagnosis not present

## 2020-03-08 DIAGNOSIS — G4733 Obstructive sleep apnea (adult) (pediatric): Secondary | ICD-10-CM | POA: Diagnosis not present

## 2020-03-16 DIAGNOSIS — M5412 Radiculopathy, cervical region: Secondary | ICD-10-CM | POA: Diagnosis not present

## 2020-03-16 DIAGNOSIS — M62830 Muscle spasm of back: Secondary | ICD-10-CM | POA: Diagnosis not present

## 2020-03-16 DIAGNOSIS — M9902 Segmental and somatic dysfunction of thoracic region: Secondary | ICD-10-CM | POA: Diagnosis not present

## 2020-03-16 DIAGNOSIS — M9901 Segmental and somatic dysfunction of cervical region: Secondary | ICD-10-CM | POA: Diagnosis not present

## 2020-07-19 ENCOUNTER — Encounter: Payer: Self-pay | Admitting: Dermatology

## 2020-07-19 ENCOUNTER — Other Ambulatory Visit: Payer: Self-pay

## 2020-07-19 ENCOUNTER — Ambulatory Visit (INDEPENDENT_AMBULATORY_CARE_PROVIDER_SITE_OTHER): Payer: 59 | Admitting: Dermatology

## 2020-07-19 DIAGNOSIS — L820 Inflamed seborrheic keratosis: Secondary | ICD-10-CM | POA: Diagnosis not present

## 2020-07-19 DIAGNOSIS — Z1283 Encounter for screening for malignant neoplasm of skin: Secondary | ICD-10-CM

## 2020-07-19 DIAGNOSIS — Z85828 Personal history of other malignant neoplasm of skin: Secondary | ICD-10-CM | POA: Diagnosis not present

## 2020-07-19 DIAGNOSIS — L578 Other skin changes due to chronic exposure to nonionizing radiation: Secondary | ICD-10-CM

## 2020-07-19 DIAGNOSIS — D229 Melanocytic nevi, unspecified: Secondary | ICD-10-CM

## 2020-07-19 DIAGNOSIS — D2372 Other benign neoplasm of skin of left lower limb, including hip: Secondary | ICD-10-CM

## 2020-07-19 DIAGNOSIS — L738 Other specified follicular disorders: Secondary | ICD-10-CM

## 2020-07-19 DIAGNOSIS — Z86018 Personal history of other benign neoplasm: Secondary | ICD-10-CM

## 2020-07-19 DIAGNOSIS — L821 Other seborrheic keratosis: Secondary | ICD-10-CM

## 2020-07-19 DIAGNOSIS — D1800 Hemangioma unspecified site: Secondary | ICD-10-CM

## 2020-07-19 DIAGNOSIS — L814 Other melanin hyperpigmentation: Secondary | ICD-10-CM

## 2020-07-19 NOTE — Progress Notes (Signed)
Follow-Up Visit   Subjective  Dalton Huff is a 45 y.o. male who presents for the following: Annual Exam (Here for annual skin cancer screening. Full body. Hx of BCC and DN.) and Seborrheic Keratosis (Lesion on right temple has been frozen in the past. Has grown back.  Rubbed by C-PAP strap. Irritated.).  The following portions of the chart were reviewed this encounter and updated as appropriate:  Tobacco  Allergies  Meds  Problems  Med Hx  Surg Hx  Fam Hx      Review of Systems: No other skin or systemic complaints except as noted in HPI or Assessment and Plan.  Objective  Well appearing patient in no apparent distress; mood and affect are within normal limits.  A full examination was performed including scalp, head, eyes, ears, nose, lips, neck, chest, axillae, abdomen, back, buttocks, bilateral upper extremities, bilateral lower extremities, hands, feet, fingers, toes, fingernails, and toenails. All findings within normal limits unless otherwise noted below.  Objective  Right Temple x1, left chest x1 (2): Erythematous keratotic or waxy stuck-on papule or plaque.   Assessment & Plan    Lentigines - Scattered tan macules - Due to sun exposure - Benign-appering, observe - Recommend daily broad spectrum sunscreen SPF 30+ to sun-exposed areas, reapply every 2 hours as needed. - Call for any changes  Seborrheic Keratoses - Stuck-on, waxy, tan-brown papules and/or plaques  - Benign-appearing - Discussed benign etiology and prognosis. - Observe - Call for any changes  Melanocytic Nevi - Tan-brown and/or pink-flesh-colored symmetric macules and papules - Benign appearing on exam today - Observation - Call clinic for new or changing moles - Recommend daily use of broad spectrum spf 30+ sunscreen to sun-exposed areas.   Hemangiomas - Red papules - Discussed benign nature - Observe - Call for any changes  Actinic Damage - Chronic condition, secondary to  cumulative UV/sun exposure - diffuse scaly erythematous macules with underlying dyspigmentation - Recommend daily broad spectrum sunscreen SPF 30+ to sun-exposed areas, reapply every 2 hours as needed.  - Staying in the shade or wearing long sleeves, sun glasses (UVA+UVB protection) and wide brim hats (4-inch brim around the entire circumference of the hat) are also recommended for sun protection.  - Call for new or changing lesions.  Skin cancer screening performed today.  History of Basal Cell Carcinoma of the Skin, left posterior shoulder - No evidence of recurrence today - Recommend regular full body skin exams - Recommend daily broad spectrum sunscreen SPF 30+ to sun-exposed areas, reapply every 2 hours as needed.  - Call if any new or changing lesions are noted between office visits  History of Dysplastic Nevi, right lateral abdomen (mod.), mid chest (mod.), left spinal lower back (mod-severe) - No evidence of recurrence today - Recommend regular full body skin exams - Recommend daily broad spectrum sunscreen SPF 30+ to sun-exposed areas, reapply every 2 hours as needed.  - Call if any new or changing lesions are noted between office visits  Dermatofibroma, left pretibial. - Firm pink/brown papulenodule with dimple sign - Benign appearing - Call for any changes  Sebaceous Hyperplasia, right temple - Small yellow papule with a central dell - Benign - Observe   Inflamed seborrheic keratosis (2) Right Temple x1, left chest x1  Recheck 1-2 months  Destruction of lesion - Right Temple x1, left chest x1  Destruction method: cryotherapy   Informed consent: discussed and consent obtained   Lesion destroyed using liquid nitrogen: Yes  Region frozen until ice ball extended beyond lesion: Yes   Outcome: patient tolerated procedure well with no complications   Post-procedure details: wound care instructions given    Return in about 6 months (around 01/18/2021) for TBSE. 1-2  months, recheck ISK.  I, Emelia Salisbury, CMA, am acting as scribe for Forest Gleason, MD. Documentation: I have reviewed the above documentation for accuracy and completeness, and I agree with the above.  Forest Gleason, MD

## 2020-07-19 NOTE — Patient Instructions (Addendum)
Melanoma ABCDEs  Melanoma is the most dangerous type of skin cancer, and is the leading cause of death from skin disease.  You are more likely to develop melanoma if you:  Have light-colored skin, light-colored eyes, or red or blond hair  Spend a lot of time in the sun  Tan regularly, either outdoors or in a tanning bed  Have had blistering sunburns, especially during childhood  Have a close family member who has had a melanoma  Have atypical moles or large birthmarks  Early detection of melanoma is key since treatment is typically straightforward and cure rates are extremely high if we catch it early.   The first sign of melanoma is often a change in a mole or a new dark spot.  The ABCDE system is a way of remembering the signs of melanoma.  A for asymmetry:  The two halves do not match. B for border:  The edges of the growth are irregular. C for color:  A mixture of colors are present instead of an even brown color. D for diameter:  Melanomas are usually (but not always) greater than 38mm - the size of a pencil eraser. E for evolution:  The spot keeps changing in size, shape, and color.  Please check your skin once per month between visits. You can use a small mirror in front and a large mirror behind you to keep an eye on the back side or your body.   If you see any new or changing lesions before your next follow-up, please call to schedule a visit.  Please continue daily skin protection including broad spectrum sunscreen SPF 30+ to sun-exposed areas, reapplying every 2 hours as needed when you're outdoors.   Staying in the shade or wearing long sleeves, sun glasses (UVA+UVB protection) and wide brim hats (4-inch brim around the entire circumference of the hat) are also recommended for sun protection.   If you have any questions or concerns for your doctor, please call our main line at [phone] and press option 4 to reach your doctor's medical assistant. If no one answers,  please leave a voicemail as directed and we will return your call as soon as possible. Messages left after 4 pm will be answered the following business day.   You may also send [place] a message via [place]. We typically respond to MyChart messages within 1-2 business days.  For prescription refills, please ask your pharmacy to contact our office. Our fax number is [phone].  If you have an urgent issue when the clinic is closed that cannot wait until the next business day, you can page your doctor at the number below.    Please note that while we do our best to be available for urgent issues outside of office hours, we are not available 24/7.   If you have an urgent issue and are unable to reach [place], you may choose to seek medical care at your doctor's office, retail clinic, urgent care center, or emergency room.  If you have a medical emergency, please immediately call 911 or go to the emergency department.  Pager Numbers  - Dr. Nehemiah Massed: [phone]  - Dr. Laurence Ferrari: [phone]  - Dr. Nicole Kindred: [phone]  In the event of inclement weather, please call our main line at [phone] for an update on the status of any delays or closures.  Dermatology Medication Tips: Please keep the boxes that topical medications come in in order to help keep track of the instructions about where and how  to use these. Pharmacies typically print the medication instructions only on the boxes and not directly on the medication tubes.   If your medication is too expensive, please contact our office at [phone] option 4 or send [place] a message through [place].   We are unable to tell what your co-pay for medications will be in advance as this is different depending on your insurance coverage. However, we may be able to find a substitute medication at lower cost or fill out paperwork to get insurance to cover a needed medication.   If a prior authorization is required to get your medication covered by your  insurance company, please allow [place] 1-2 business days to complete this process.  Drug prices often vary depending on where the prescription is filled and some pharmacies may offer cheaper prices.  The website www.goodrx.com contains coupons for medications through different pharmacies. The prices here do not account for what the cost may be with help from insurance (it may be cheaper with your insurance), but the website can give you the price if you did not use any insurance.  - You can print the associated coupon and take it with your prescription to the pharmacy.  - You may also stop by our office during regular business hours and pick up a GoodRx coupon card.  - If you need your prescription sent electronically to a different pharmacy, notify our office through [facility] or by phone at [phone] option 4.  Recommend taking Heliocare sun protection supplement daily in sunny weather for additional sun protection. For maximum protection on the sunniest days, you can take up to 2 capsules of regular Heliocare OR take 1 capsule of Heliocare Ultra. For prolonged exposure (such as a full day in the sun), you can repeat your dose of the supplement 4 hours after your first dose. Heliocare can be purchased at [facility] or at [email or web address].   Prior to procedure, discussed risks of blister formation, small wound, skin dyspigmentation, or rare scar following cryotherapy.   Cryotherapy  Cryotherapy is the treatment of lesions with the application of a cold substance.  In most cases, liquid nitrogen is used to destroy the lesion(s).  Liquid nitrogen is so cold, -196 Celsius, it feels like it is burning when it is applied.  After treatment with liquid nitrogen, there may be some burning sensation or pain that can last up to 24 hours.  The area may also be swollen and red.  The discomfort can be relieved with ibuprofen (Advil, Motrin), acetaminophen (Extra Strength Tylenol), or  similar pain relief medication.  Within 24 to 48 hours, a blister may form.  Occasionally, these blisters will become filled with blood and become very dark.  This is no cause for concern.  The blister will gradually dry up over a period of several days, eventually separating from the healing skin below in about one to two weeks.  The surrounding skin will become red and the area may become itchy.  This is all part of the normal healing process.  Occasionally, the crusts will last as long as four weeks when certain deeper spots on the skin are treated.  Sometimes a permanent white mark or scar will be left after healing.  You may continue all of your normal activities as long as they do not cause pain in the treated areas.  It is okay to get the area wet.  After therapy or if the blister is still intact - You may treat  it like normal skin.  Covering the area with a bandage may offer some comfort and protection from trauma but is not absolutely necessary.  If the blister is uncomfortable - Clean a small needle with rubbing alcohol, then gently make a small hole in the side of the blister to drain the blister fluid.  This often gives immediate relief.  Do not remove the blister roof, as the blister aids in healing.  In time it will fall off on its own.   Once the blister roof falls off or a sore forms -  . Clean the blister sites with soap and water.  Rinse the area and pat dry.  Do not force off the blister roof or crust. . Apply an antibiotic ointment such as Polysporin or Bacitracin. . A bandage may be applied loosely over the blister until it is healed if desired. . Call our office if you are concerned it may be infected.  Some redness, itching and oozing is part of the normal healing process.  Signs of infection include increasing redness, increasing pain, swelling, heat, or yellow discharge.

## 2020-08-22 ENCOUNTER — Other Ambulatory Visit: Payer: Self-pay

## 2020-08-22 ENCOUNTER — Encounter: Payer: Self-pay | Admitting: Dermatology

## 2020-08-22 ENCOUNTER — Ambulatory Visit (INDEPENDENT_AMBULATORY_CARE_PROVIDER_SITE_OTHER): Payer: 59 | Admitting: Dermatology

## 2020-08-22 DIAGNOSIS — L578 Other skin changes due to chronic exposure to nonionizing radiation: Secondary | ICD-10-CM

## 2020-08-22 DIAGNOSIS — L738 Other specified follicular disorders: Secondary | ICD-10-CM

## 2020-08-22 DIAGNOSIS — L820 Inflamed seborrheic keratosis: Secondary | ICD-10-CM | POA: Diagnosis not present

## 2020-08-22 NOTE — Patient Instructions (Addendum)
If you have any questions or concerns for your doctor, please call our main line at [phone] and press option 4 to reach your doctor's medical assistant. If no one answers, please leave a voicemail as directed and we will return your call as soon as possible. Messages left after 4 pm will be answered the following business day.   You may also send [place] a message via [place]. We typically respond to MyChart messages within 1-2 business days.  For prescription refills, please ask your pharmacy to contact our office. Our fax number is [phone].  If you have an urgent issue when the clinic is closed that cannot wait until the next business day, you can page your doctor at the number below.    Please note that while we do our best to be available for urgent issues outside of office hours, we are not available 24/7.   If you have an urgent issue and are unable to reach [place], you may choose to seek medical care at your doctor's office, retail clinic, urgent care center, or emergency room.  If you have a medical emergency, please immediately call 911 or go to the emergency department.  Pager Numbers  - Dr. Nehemiah Massed: [phone]  - Dr. Laurence Ferrari: [phone]  - Dr. Nicole Kindred: [phone]  In the event of inclement weather, please call our main line at [phone] for an update on the status of any delays or closures.  Dermatology Medication Tips: Please keep the boxes that topical medications come in in order to help keep track of the instructions about where and how to use these. Pharmacies typically print the medication instructions only on the boxes and not directly on the medication tubes.   If your medication is too expensive, please contact our office at [phone] option 4 or send [place] a message through [place].   We are unable to tell what your co-pay for medications will be in advance as this is different depending on your insurance coverage. However, we may be able to find a substitute  medication at lower cost or fill out paperwork to get insurance to cover a needed medication.   If a prior authorization is required to get your medication covered by your insurance company, please allow [place] 1-2 business days to complete this process.  Drug prices often vary depending on where the prescription is filled and some pharmacies may offer cheaper prices.  The website www.goodrx.com contains coupons for medications through different pharmacies. The prices here do not account for what the cost may be with help from insurance (it may be cheaper with your insurance), but the website can give you the price if you did not use any insurance.  - You can print the associated coupon and take it with your prescription to the pharmacy.  - You may also stop by our office during regular business hours and pick up a GoodRx coupon card.  - If you need your prescription sent electronically to a different pharmacy, notify our office through [facility] or by phone at [phone] option 4.   Cryotherapy Aftercare  . Wash gently with soap and water everyday.   Apply Vaseline and Band-Aid daily until healed.      Recommend taking Heliocare sun protection supplement daily in sunny weather for additional sun protection. For maximum protection on the sunniest days, you can take up to 2 capsules of regular Heliocare OR take 1 capsule of Heliocare Ultra. For prolonged exposure (such as a full day in the sun),  you can repeat your dose of the supplement 4 hours after your first dose. Heliocare can be purchased at [facility] or at [email or web address].

## 2020-08-22 NOTE — Progress Notes (Signed)
   Follow-Up Visit   Subjective  Dalton Huff is a 45 y.o. male who presents for the following: Follow-up (4 weeks f/u ISK's on the face and chest treated with LN2 with a fair response ).   The following portions of the chart were reviewed this encounter and updated as appropriate:   Tobacco  Allergies  Meds  Problems  Med Hx  Surg Hx  Fam Hx      Review of Systems:  No other skin or systemic complaints except as noted in HPI or Assessment and Plan.  Objective  Well appearing patient in no apparent distress; mood and affect are within normal limits.  A focused examination was performed including face,chest . Relevant physical exam findings are noted in the Assessment and Plan.  Objective  right temple: Erythematous keratotic or waxy stuck-on papule or plaque.   Objective  face: Yellow papules   Assessment & Plan  Inflamed seborrheic keratosis right temple  Prior to procedure, discussed risks of blister formation, small wound, skin dyspigmentation, or rare scar following cryotherapy.    Destruction of lesion - right temple Complexity: simple   Destruction method: cryotherapy   Informed consent: discussed and consent obtained   Timeout:  patient name, date of birth, surgical site, and procedure verified Lesion destroyed using liquid nitrogen: Yes   Region frozen until ice ball extended beyond lesion: Yes   Outcome: patient tolerated procedure well with no complications   Post-procedure details: wound care instructions given    Sebaceous hyperplasia face  Benign-appearing.  Observation.  Call clinic for new or changing lesions.    Actinic Damage - chronic, secondary to cumulative UV radiation exposure/sun exposure over time - diffuse scaly erythematous macules with underlying dyspigmentation - Recommend daily broad spectrum sunscreen SPF 30+ to sun-exposed areas, reapply every 2 hours as needed.  - Recommend staying in the shade or wearing long sleeves,  sun glasses (UVA+UVB protection) and wide brim hats (4-inch brim around the entire circumference of the hat). - Call for new or changing lesions. - Recommend taking Heliocare sun protection supplement daily in sunny weather for additional sun protection. For maximum protection on the sunniest days, you can take up to 2 capsules of regular Heliocare OR take 1 capsule of Heliocare Ultra. For prolonged exposure (such as a full day in the sun), you can repeat your dose of the supplement 4 hours after your first dose.   Return for as scheduled for TBSE in Oct .  I, Marye Round, CMA, am acting as scribe for Forest Gleason, MD   Documentation: I have reviewed the above documentation for accuracy and completeness, and I agree with the above.  Forest Gleason, MD

## 2020-08-28 ENCOUNTER — Other Ambulatory Visit: Payer: Self-pay

## 2020-08-28 MED ORDER — METFORMIN HCL 500 MG PO TABS
ORAL_TABLET | ORAL | 3 refills | Status: AC
  Filled 2020-08-28: qty 180, 90d supply, fill #0
  Filled 2021-01-15: qty 180, 90d supply, fill #1

## 2020-09-14 ENCOUNTER — Other Ambulatory Visit: Payer: Self-pay

## 2020-09-14 MED ORDER — CETIRIZINE HCL 10 MG PO TABS
ORAL_TABLET | ORAL | 10 refills | Status: AC
  Filled 2020-09-14: qty 90, 90d supply, fill #0

## 2020-09-19 ENCOUNTER — Other Ambulatory Visit: Payer: Self-pay | Admitting: Orthopedic Surgery

## 2020-09-19 DIAGNOSIS — M25461 Effusion, right knee: Secondary | ICD-10-CM

## 2020-09-19 DIAGNOSIS — M25561 Pain in right knee: Secondary | ICD-10-CM

## 2020-09-19 DIAGNOSIS — M1711 Unilateral primary osteoarthritis, right knee: Secondary | ICD-10-CM

## 2020-09-21 ENCOUNTER — Ambulatory Visit
Admission: RE | Admit: 2020-09-21 | Discharge: 2020-09-21 | Disposition: A | Discharge status: Home / Self Care | Payer: 59 | Source: Ambulatory Visit | Attending: Orthopedic Surgery | Admitting: Orthopedic Surgery

## 2020-09-21 ENCOUNTER — Other Ambulatory Visit: Payer: Self-pay

## 2020-09-21 DIAGNOSIS — M25561 Pain in right knee: Secondary | ICD-10-CM | POA: Diagnosis not present

## 2020-09-21 DIAGNOSIS — M1711 Unilateral primary osteoarthritis, right knee: Secondary | ICD-10-CM | POA: Diagnosis present

## 2020-09-21 DIAGNOSIS — M25461 Effusion, right knee: Secondary | ICD-10-CM | POA: Insufficient documentation

## 2020-10-13 ENCOUNTER — Other Ambulatory Visit: Payer: Self-pay

## 2020-10-24 ENCOUNTER — Other Ambulatory Visit: Payer: Self-pay

## 2021-01-15 ENCOUNTER — Other Ambulatory Visit: Payer: Self-pay

## 2021-01-25 ENCOUNTER — Ambulatory Visit (INDEPENDENT_AMBULATORY_CARE_PROVIDER_SITE_OTHER): Payer: 59 | Admitting: Dermatology

## 2021-01-25 ENCOUNTER — Encounter: Payer: Self-pay | Admitting: Dermatology

## 2021-01-25 ENCOUNTER — Other Ambulatory Visit: Payer: Self-pay

## 2021-01-25 DIAGNOSIS — Z86018 Personal history of other benign neoplasm: Secondary | ICD-10-CM

## 2021-01-25 DIAGNOSIS — L820 Inflamed seborrheic keratosis: Secondary | ICD-10-CM | POA: Diagnosis not present

## 2021-01-25 DIAGNOSIS — L578 Other skin changes due to chronic exposure to nonionizing radiation: Secondary | ICD-10-CM

## 2021-01-25 DIAGNOSIS — L821 Other seborrheic keratosis: Secondary | ICD-10-CM

## 2021-01-25 DIAGNOSIS — L570 Actinic keratosis: Secondary | ICD-10-CM

## 2021-01-25 DIAGNOSIS — L738 Other specified follicular disorders: Secondary | ICD-10-CM

## 2021-01-25 DIAGNOSIS — Z1283 Encounter for screening for malignant neoplasm of skin: Secondary | ICD-10-CM

## 2021-01-25 DIAGNOSIS — D229 Melanocytic nevi, unspecified: Secondary | ICD-10-CM

## 2021-01-25 DIAGNOSIS — B353 Tinea pedis: Secondary | ICD-10-CM

## 2021-01-25 DIAGNOSIS — L814 Other melanin hyperpigmentation: Secondary | ICD-10-CM

## 2021-01-25 DIAGNOSIS — D1800 Hemangioma unspecified site: Secondary | ICD-10-CM

## 2021-01-25 DIAGNOSIS — Z85828 Personal history of other malignant neoplasm of skin: Secondary | ICD-10-CM

## 2021-01-25 NOTE — Progress Notes (Signed)
Follow-Up Visit   Subjective  Dalton Huff is a 45 y.o. male who presents for the following: FBSE (Patient here for full body skin exam and skin cancer screening. Patient with hx of BCC, dysplastic nevi. Patient does have a spot at right sideburn that has been treated with LN2 in the past and seems to keep coming back. ).   The following portions of the chart were reviewed this encounter and updated as appropriate:   Tobacco  Allergies  Meds  Problems  Med Hx  Surg Hx  Fam Hx      Review of Systems:  No other skin or systemic complaints except as noted in HPI or Assessment and Plan.  Objective  Well appearing patient in no apparent distress; mood and affect are within normal limits.  A full examination was performed including scalp, head, eyes, ears, nose, lips, neck, chest, axillae, abdomen, back, buttocks, bilateral upper extremities, bilateral lower extremities, hands, feet, fingers, toes, fingernails, and toenails. All findings within normal limits unless otherwise noted below.  right neck x 1 Erythematous thin papules/macules with gritty scale.   Right preauricular Erythematous keratotic or waxy stuck-on papule or plaque.   face Small yellow papules with a central dell.   bilateral feet Scale at webspaces   Assessment & Plan  AK (actinic keratosis) right neck x 1  Actinic keratoses are precancerous spots that appear secondary to cumulative UV radiation exposure/sun exposure over time. They are chronic with expected duration over 1 year. A portion of actinic keratoses will progress to squamous cell carcinoma of the skin. It is not possible to reliably predict which spots will progress to skin cancer and so treatment is recommended to prevent development of skin cancer.  Recommend daily broad spectrum sunscreen SPF 30+ to sun-exposed areas, reapply every 2 hours as needed.  Recommend staying in the shade or wearing long sleeves, sun glasses (UVA+UVB protection)  and wide brim hats (4-inch brim around the entire circumference of the hat). Call for new or changing lesions.  Prior to procedure, discussed risks of blister formation, small wound, skin dyspigmentation, or rare scar following cryotherapy. Recommend Vaseline ointment to treated areas while healing.   Destruction of lesion - right neck x 1 Complexity: simple   Destruction method: cryotherapy   Informed consent: discussed and consent obtained   Timeout:  patient name, date of birth, surgical site, and procedure verified Lesion destroyed using liquid nitrogen: Yes   Region frozen until ice ball extended beyond lesion: Yes   Outcome: patient tolerated procedure well with no complications   Post-procedure details: wound care instructions given    Inflamed seborrheic keratosis Right preauricular  Prior to procedure discussed expected wound care and risk of scarring, hyperpigmentation, recurrence.  Destruction of lesion - Right preauricular  Destruction method: electrodesiccation and curettage   Destruction method comment:  Electrodessication Informed consent: discussed and consent obtained   Timeout:  patient name, date of birth, surgical site, and procedure verified Patient was prepped and draped in usual sterile fashion: area prepped with isopropyl alcohol. Anesthesia: the lesion was anesthetized in a standard fashion   Anesthetic:  1% lidocaine w/ epinephrine 1-100,000 buffered w/ 8.4% NaHCO3 Hemostasis achieved with:  electrodesiccation Outcome: patient tolerated procedure well with no complications   Post-procedure details: wound care instructions given   Additional details:  Mupirocin and a pressure dressing applied  Sebaceous hyperplasia face  Benign-appearing.  Observation.  Call clinic for new or changing lesions.  Recommend daily use of broad  spectrum spf 30+ sunscreen to sun-exposed areas.    Tinea pedis of both feet bilateral feet  Recommend over the counter  terbinafine cream to feet twice daily until clear then once weekly for prevention.   Lentigines - Scattered tan macules - Due to sun exposure - Benign-appearing, observe - Recommend daily broad spectrum sunscreen SPF 30+ to sun-exposed areas, reapply every 2 hours as needed. - Call for any changes  Seborrheic Keratoses - Stuck-on, waxy, tan-brown papules and/or plaques  - Benign-appearing - Discussed benign etiology and prognosis. - Observe - Call for any changes  Melanocytic Nevi - Tan-brown and/or pink-flesh-colored symmetric macules and papules - Benign appearing on exam today - Observation - Call clinic for new or changing moles - Recommend daily use of broad spectrum spf 30+ sunscreen to sun-exposed areas.   Hemangiomas - Red papules - Discussed benign nature - Observe - Call for any changes  Actinic Damage - Chronic condition, secondary to cumulative UV/sun exposure - diffuse scaly erythematous macules with underlying dyspigmentation - Recommend daily broad spectrum sunscreen SPF 30+ to sun-exposed areas, reapply every 2 hours as needed.  - Staying in the shade or wearing long sleeves, sun glasses (UVA+UVB protection) and wide brim hats (4-inch brim around the entire circumference of the hat) are also recommended for sun protection.  - Call for new or changing lesions.  Skin cancer screening performed today.  History of Basal Cell Carcinoma of the Skin - No evidence of recurrence today - Recommend regular full body skin exams - Recommend daily broad spectrum sunscreen SPF 30+ to sun-exposed areas, reapply every 2 hours as needed.  - Call if any new or changing lesions are noted between office visits  History of Dysplastic Nevi - No evidence of recurrence today - Recommend regular full body skin exams - Recommend daily broad spectrum sunscreen SPF 30+ to sun-exposed areas, reapply every 2 hours as needed.  - Call if any new or changing lesions are noted between  office visits  Return in about 1 year (around 01/25/2022) for TBSE.  Graciella Belton, RMA, am acting as scribe for Forest Gleason, MD .  Documentation: I have reviewed the above documentation for accuracy and completeness, and I agree with the above.  Forest Gleason, MD

## 2021-01-25 NOTE — Patient Instructions (Addendum)
Cryotherapy Aftercare  Wash gently with soap and water everyday.   Apply Vaseline and Band-Aid daily until healed.   Prior to procedure, discussed risks of blister formation, small wound, skin dyspigmentation, or rare scar following cryotherapy. Recommend Vaseline ointment to treated areas while healing.  Recommend over the counter terbinafine cream to feet twice daily until clear then once weekly for prevention.   Melanoma ABCDEs  Melanoma is the most dangerous type of skin cancer, and is the leading cause of death from skin disease.  You are more likely to develop melanoma if you: Have light-colored skin, light-colored eyes, or red or blond hair Spend a lot of time in the sun Tan regularly, either outdoors or in a tanning bed Have had blistering sunburns, especially during childhood Have a close family member who has had a melanoma Have atypical moles or large birthmarks  Early detection of melanoma is key since treatment is typically straightforward and cure rates are extremely high if we catch it early.   The first sign of melanoma is often a change in a mole or a new dark spot.  The ABCDE system is a way of remembering the signs of melanoma.  A for asymmetry:  The two halves do not match. B for border:  The edges of the growth are irregular. C for color:  A mixture of colors are present instead of an even brown color. D for diameter:  Melanomas are usually (but not always) greater than 55mm - the size of a pencil eraser. E for evolution:  The spot keeps changing in size, shape, and color.  Please check your skin once per month between visits. You can use a small mirror in front and a large mirror behind you to keep an eye on the back side or your body.   If you see any new or changing lesions before your next follow-up, please call to schedule a visit.  Please continue daily skin protection including broad spectrum sunscreen SPF 30+ to sun-exposed areas, reapplying every 2 hours  as needed when you're outdoors.    If you have any questions or concerns for your doctor, please call our main line at [phone] and press option 4 to reach your doctor's medical assistant. If no one answers, please leave a voicemail as directed and we will return your call as soon as possible. Messages left after 4 pm will be answered the following business day.   You may also send [place] a message via [place]. We typically respond to MyChart messages within 1-2 business days.  For prescription refills, please ask your pharmacy to contact our office. Our fax number is [phone].  If you have an urgent issue when the clinic is closed that cannot wait until the next business day, you can page your doctor at the number below.    Please note that while we do our best to be available for urgent issues outside of office hours, we are not available 24/7.   If you have an urgent issue and are unable to reach [place], you may choose to seek medical care at your doctor's office, retail clinic, urgent care center, or emergency room.  If you have a medical emergency, please immediately call 911 or go to the emergency department.  Pager Numbers  - Dr. Nehemiah Massed: [phone]  - Dr. Laurence Ferrari: [phone]  - Dr. Nicole Kindred: [phone]  In the event of inclement weather, please call our main line at [phone] for an update on the status of any delays  or closures.  Dermatology Medication Tips: Please keep the boxes that topical medications come in in order to help keep track of the instructions about where and how to use these. Pharmacies typically print the medication instructions only on the boxes and not directly on the medication tubes.   If your medication is too expensive, please contact our office at [phone] option 4 or send [place] a message through [place].   We are unable to tell what your co-pay for medications will be in advance as this is different depending on your insurance coverage.  However, we may be able to find a substitute medication at lower cost or fill out paperwork to get insurance to cover a needed medication.   If a prior authorization is required to get your medication covered by your insurance company, please allow [place] 1-2 business days to complete this process.  Drug prices often vary depending on where the prescription is filled and some pharmacies may offer cheaper prices.  The website www.goodrx.com contains coupons for medications through different pharmacies. The prices here do not account for what the cost may be with help from insurance (it may be cheaper with your insurance), but the website can give you the price if you did not use any insurance.  - You can print the associated coupon and take it with your prescription to the pharmacy.  - You may also stop by our office during regular business hours and pick up a GoodRx coupon card.  - If you need your prescription sent electronically to a different pharmacy, notify our office through [facility] or by phone at [phone] option 4.

## 2021-05-15 ENCOUNTER — Other Ambulatory Visit: Payer: Self-pay

## 2021-05-15 DIAGNOSIS — R509 Fever, unspecified: Secondary | ICD-10-CM | POA: Diagnosis not present

## 2021-05-15 DIAGNOSIS — Z125 Encounter for screening for malignant neoplasm of prostate: Secondary | ICD-10-CM | POA: Diagnosis not present

## 2021-05-15 DIAGNOSIS — E7800 Pure hypercholesterolemia, unspecified: Secondary | ICD-10-CM | POA: Diagnosis not present

## 2021-05-15 DIAGNOSIS — Z03818 Encounter for observation for suspected exposure to other biological agents ruled out: Secondary | ICD-10-CM | POA: Diagnosis not present

## 2021-05-15 DIAGNOSIS — I10 Essential (primary) hypertension: Secondary | ICD-10-CM | POA: Diagnosis not present

## 2021-05-15 DIAGNOSIS — G4733 Obstructive sleep apnea (adult) (pediatric): Secondary | ICD-10-CM | POA: Diagnosis not present

## 2021-05-15 DIAGNOSIS — R739 Hyperglycemia, unspecified: Secondary | ICD-10-CM | POA: Diagnosis not present

## 2021-05-15 DIAGNOSIS — Z0000 Encounter for general adult medical examination without abnormal findings: Secondary | ICD-10-CM | POA: Diagnosis not present

## 2021-05-15 DIAGNOSIS — Z79899 Other long term (current) drug therapy: Secondary | ICD-10-CM | POA: Diagnosis not present

## 2021-05-15 MED ORDER — AZITHROMYCIN 500 MG PO TABS
ORAL_TABLET | ORAL | 0 refills | Status: AC
  Filled 2021-05-15: qty 5, 5d supply, fill #0

## 2021-05-15 MED ORDER — PREDNISONE 10 MG PO TABS
ORAL_TABLET | ORAL | 0 refills | Status: AC
  Filled 2021-05-15: qty 26, 8d supply, fill #0

## 2021-09-13 ENCOUNTER — Other Ambulatory Visit: Payer: Self-pay

## 2021-09-13 MED ORDER — CETIRIZINE HCL 10 MG PO TABS: ORAL_TABLET | ORAL | 10 refills | Status: AC

## 2021-11-18 IMAGING — MR MR KNEE*R* W/O CM
6 series · 40 of 40 positions shown · non-contrast
Comparison: None.

CLINICAL DATA: Medial right knee pain after twisting injury playing
softball in Tuesday July, 2020

EXAM:
MRI OF THE RIGHT KNEE WITHOUT CONTRAST
TECHNIQUE: Multiplanar, multisequence MR imaging of the knee was performed. No
intravenous contrast was administered.

[Series 8: T2 fat-sat · axial · right · 4.0mm · 0.50mm/px · z∈[-65,+58]mm · 7 of 26 slices shown (1 of 3)]
[im 1/26]
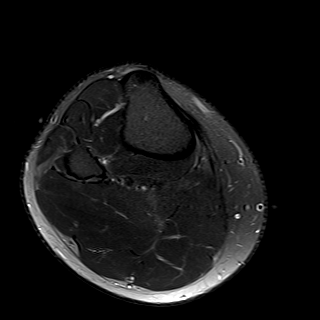
[im 5/26]
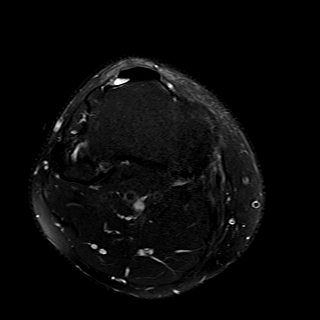
[im 9/26]
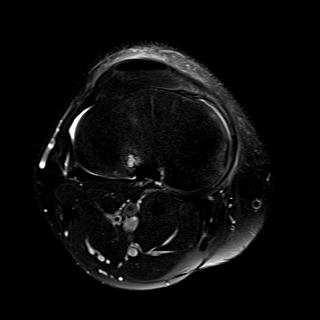
[im 13/26]
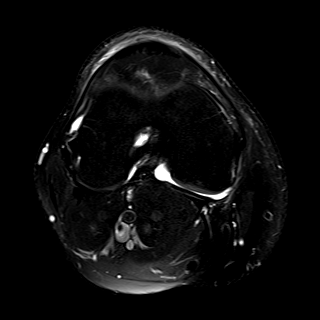
[im 17/26]
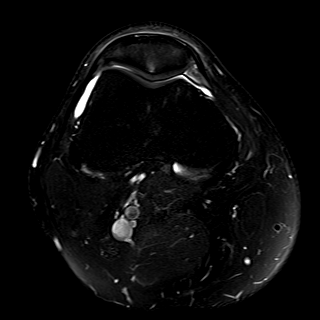
[im 21/26]
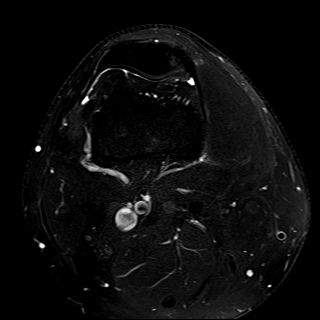
[im 26/26]
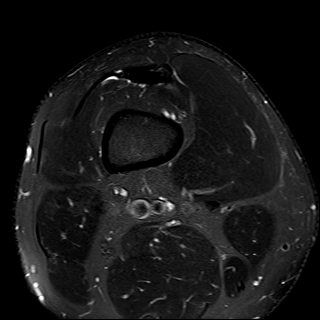

[Series 9: T1 · coronal · right · 4.0mm · 0.47mm/px · 7 of 26 slices shown]
[im 1/26]
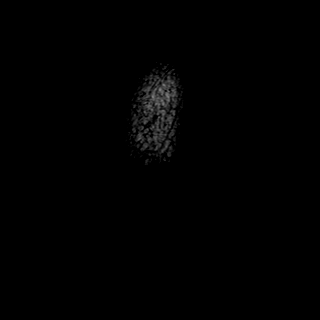
[im 5/26]
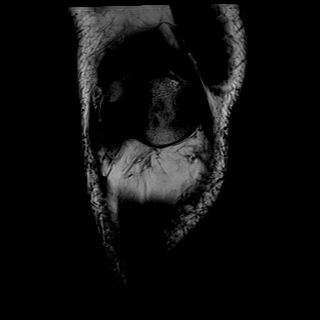
[im 9/26]
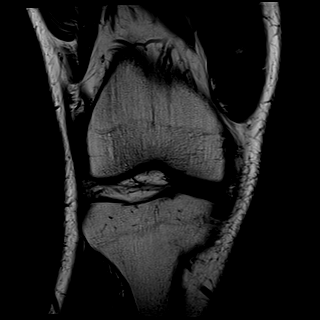
[im 13/26]
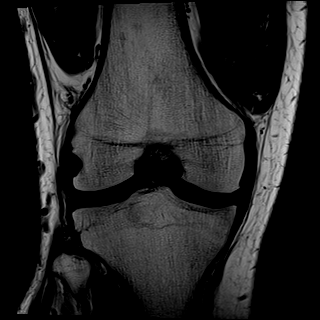
[im 17/26]
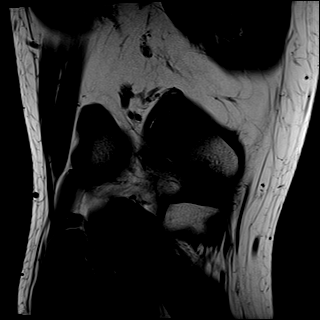
[im 21/26]
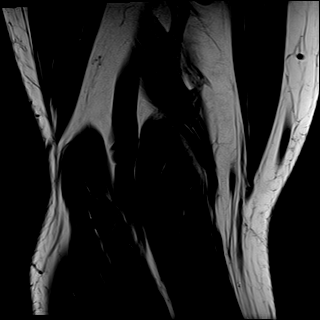
[im 26/26]
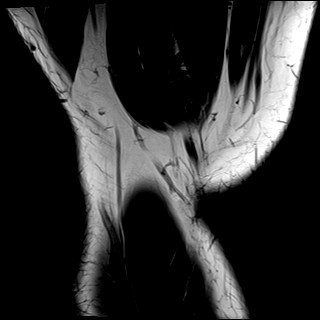

[Series 10: T2 fat-sat · coronal · right · 4.0mm · 0.47mm/px · 6 of 26 slices shown (2 of 3)]
[im 1/26]
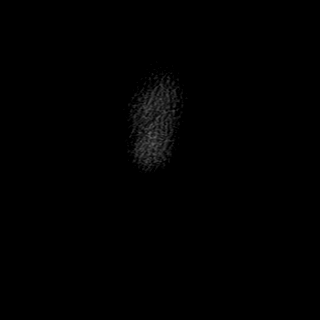
[im 6/26]
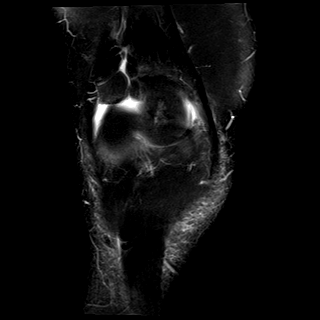
[im 11/26]
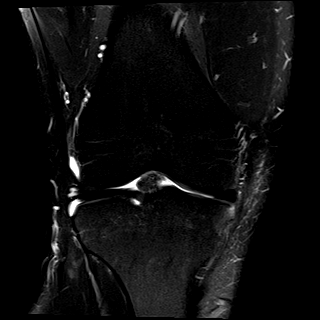
[im 16/26]
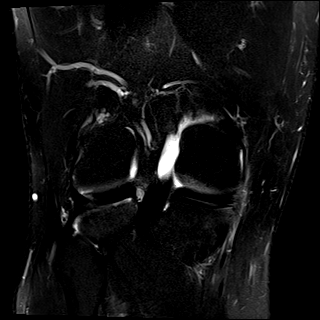
[im 21/26]
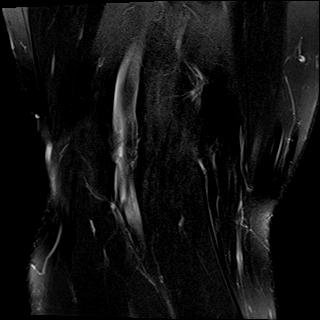
[im 26/26]
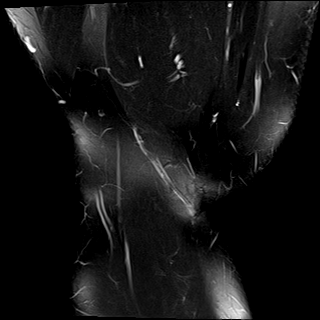

[Series 11: PD fat-sat · coronal · right · 4.0mm · 0.59mm/px · 6 of 26 slices shown (1 of 2)]
[im 1/26]
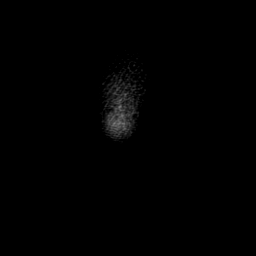
[im 6/26]
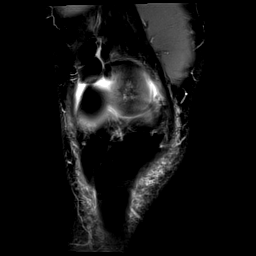
[im 11/26]
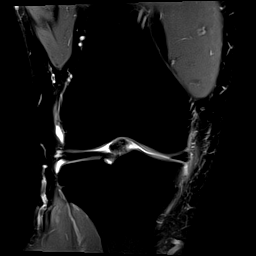
[im 16/26]
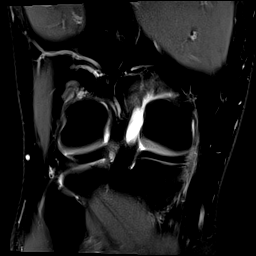
[im 21/26]
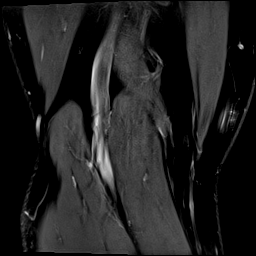
[im 26/26]
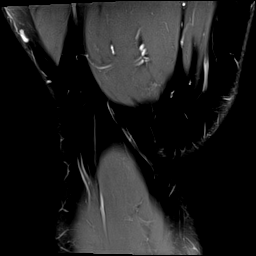

[Series 12: PD fat-sat · sagittal · right · 3.0mm · 0.47mm/px · 7 of 28 slices shown (2 of 2)]
[im 1/28]
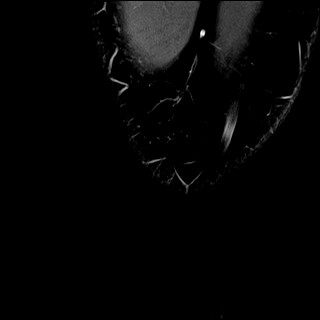
[im 5/28]
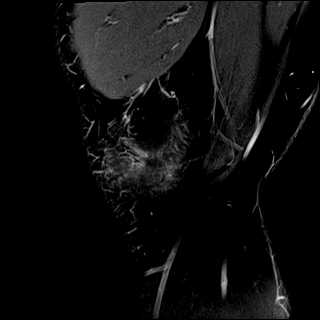
[im 10/28]
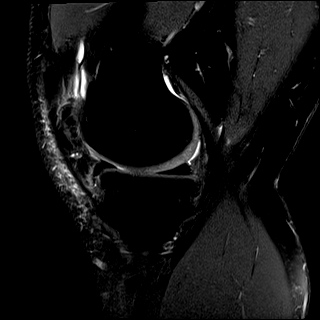
[im 14/28]
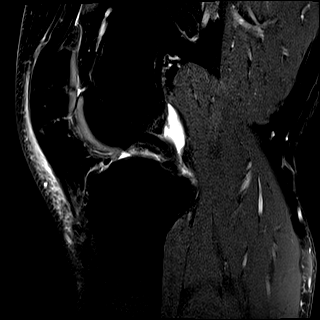
[im 19/28]
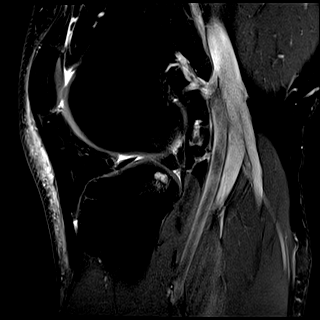
[im 23/28]
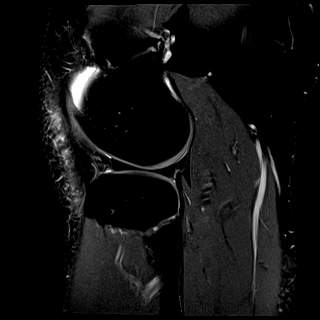
[im 28/28]
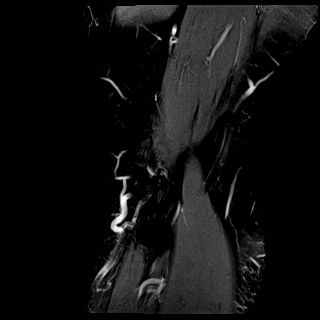

[Series 13: T2 fat-sat · sagittal · right · 3.0mm · 0.47mm/px · 7 of 28 slices shown (3 of 3)]
[im 1/28]
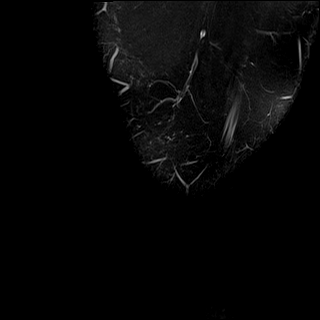
[im 5/28]
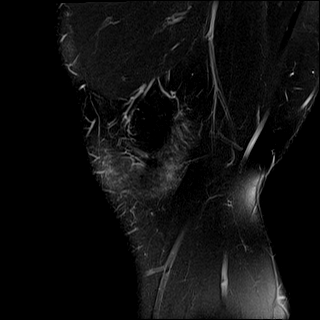
[im 10/28]
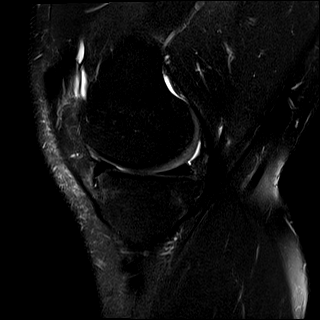
[im 14/28]
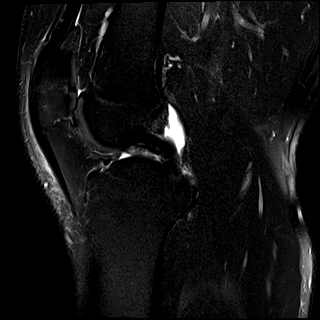
[im 19/28]
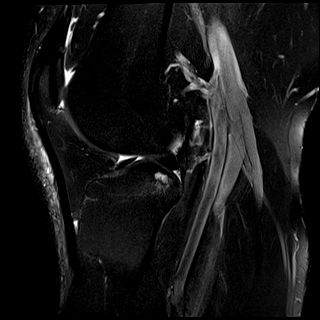
[im 23/28]
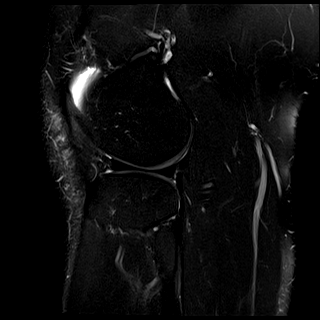
[im 28/28]
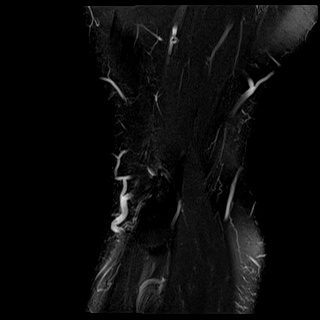

[40 of 40 positions shown; findings below may reference images not displayed]

FINDINGS: MENISCI

Medial meniscus:  Intact.

Lateral meniscus:  Intact.

LIGAMENTS

Cruciates:  Intact ACL and PCL.

Collaterals: Intact MCL with periligamentous edema. Lateral
collateral ligament complex intact.

CARTILAGE

Patellofemoral: Full-thickness cartilage fissure of the mid patellar
apex with underlying subchondral marrow signal changes (series 13,
image 14). No trochlear chondral defect.

Medial:  No chondral defect.

Lateral:  No chondral defect.

Joint:  Trace knee joint effusion.  Mild edema within Hoffa's fat.

Popliteal Fossa:  No Baker cyst. Intact popliteus tendon.

Extensor Mechanism:  Intact quadriceps tendon and patellar tendon.

Bones: Subcentimeter subcortical cyst within the posterior aspect of
the lateral tibial plateau near the tibial eminence. No acute
fracture. No malalignment. No suspicious bone lesion.

Other: Mild prepatellar subcutaneous edema. No fluid collections.
Unremarkable muscle bulk and signal intensity.
IMPRESSION: 1. Grade 1 MCL sprain.
2. Full-thickness cartilage fissure of the mid patellar apex with
underlying subchondral marrow signal changes.
3. Trace knee joint effusion.
4. Intact menisci. Intact cruciate ligaments.

## 2021-12-19 ENCOUNTER — Other Ambulatory Visit: Payer: Self-pay

## 2021-12-19 MED ORDER — MOUNJARO 5 MG/0.5ML ~~LOC~~ SOAJ
SUBCUTANEOUS | 3 refills | Status: AC
  Filled 2021-12-19 – 2022-02-21 (×3): qty 2, 28d supply, fill #0

## 2022-01-31 ENCOUNTER — Encounter: Payer: 59 | Admitting: Dermatology

## 2022-02-20 ENCOUNTER — Other Ambulatory Visit: Payer: Self-pay

## 2022-02-20 MED ORDER — MOUNJARO 5 MG/0.5ML ~~LOC~~ SOAJ
5.0000 mg | SUBCUTANEOUS | 3 refills | Status: AC
  Filled 2022-02-20 – 2022-02-21 (×2): qty 2, 28d supply, fill #0

## 2022-02-21 ENCOUNTER — Other Ambulatory Visit: Payer: Self-pay

## 2022-02-21 MED ORDER — TRULICITY 0.75 MG/0.5ML ~~LOC~~ SOAJ
SUBCUTANEOUS | 1 refills | Status: AC
  Filled 2022-02-21: qty 2, 28d supply, fill #0
  Filled 2022-02-21: qty 6, 84d supply, fill #0
  Filled 2022-04-05: qty 2, 28d supply, fill #1

## 2022-02-22 ENCOUNTER — Other Ambulatory Visit: Payer: Self-pay

## 2022-04-05 ENCOUNTER — Other Ambulatory Visit: Payer: Self-pay

## 2022-04-16 ENCOUNTER — Other Ambulatory Visit: Payer: Self-pay

## 2022-04-19 ENCOUNTER — Other Ambulatory Visit: Payer: Self-pay

## 2022-05-03 ENCOUNTER — Other Ambulatory Visit: Payer: Self-pay

## 2022-05-23 DIAGNOSIS — Z6841 Body Mass Index (BMI) 40.0 and over, adult: Secondary | ICD-10-CM | POA: Diagnosis not present

## 2022-05-23 DIAGNOSIS — I10 Essential (primary) hypertension: Secondary | ICD-10-CM | POA: Diagnosis not present

## 2022-05-23 DIAGNOSIS — E7800 Pure hypercholesterolemia, unspecified: Secondary | ICD-10-CM | POA: Diagnosis not present

## 2022-05-23 DIAGNOSIS — Z79899 Other long term (current) drug therapy: Secondary | ICD-10-CM | POA: Diagnosis not present

## 2022-05-23 DIAGNOSIS — R739 Hyperglycemia, unspecified: Secondary | ICD-10-CM | POA: Diagnosis not present

## 2022-05-23 DIAGNOSIS — Z125 Encounter for screening for malignant neoplasm of prostate: Secondary | ICD-10-CM | POA: Diagnosis not present

## 2022-05-29 DIAGNOSIS — G4733 Obstructive sleep apnea (adult) (pediatric): Secondary | ICD-10-CM | POA: Diagnosis not present

## 2022-08-21 DIAGNOSIS — Z79899 Other long term (current) drug therapy: Secondary | ICD-10-CM | POA: Diagnosis not present

## 2022-08-21 DIAGNOSIS — G4733 Obstructive sleep apnea (adult) (pediatric): Secondary | ICD-10-CM | POA: Diagnosis not present

## 2022-08-21 DIAGNOSIS — Z0000 Encounter for general adult medical examination without abnormal findings: Secondary | ICD-10-CM | POA: Diagnosis not present

## 2022-08-21 DIAGNOSIS — E7800 Pure hypercholesterolemia, unspecified: Secondary | ICD-10-CM | POA: Diagnosis not present

## 2022-08-21 DIAGNOSIS — R739 Hyperglycemia, unspecified: Secondary | ICD-10-CM | POA: Diagnosis not present

## 2022-08-21 DIAGNOSIS — Z6841 Body Mass Index (BMI) 40.0 and over, adult: Secondary | ICD-10-CM | POA: Diagnosis not present

## 2022-08-21 DIAGNOSIS — I10 Essential (primary) hypertension: Secondary | ICD-10-CM | POA: Diagnosis not present

## 2022-08-28 DIAGNOSIS — G4733 Obstructive sleep apnea (adult) (pediatric): Secondary | ICD-10-CM | POA: Diagnosis not present

## 2022-09-12 ENCOUNTER — Other Ambulatory Visit: Payer: Self-pay

## 2022-09-12 DIAGNOSIS — J309 Allergic rhinitis, unspecified: Secondary | ICD-10-CM | POA: Diagnosis not present

## 2022-09-12 DIAGNOSIS — G4733 Obstructive sleep apnea (adult) (pediatric): Secondary | ICD-10-CM | POA: Diagnosis not present

## 2022-09-12 MED ORDER — CETIRIZINE HCL 10 MG PO TABS
10.0000 mg | ORAL_TABLET | Freq: Every day | ORAL | 10 refills | Status: AC
  Filled 2022-09-12: qty 100, 100d supply, fill #0

## 2022-09-17 ENCOUNTER — Other Ambulatory Visit (HOSPITAL_COMMUNITY): Payer: Self-pay

## 2022-09-27 ENCOUNTER — Other Ambulatory Visit: Payer: Self-pay

## 2022-09-28 DIAGNOSIS — G4733 Obstructive sleep apnea (adult) (pediatric): Secondary | ICD-10-CM | POA: Diagnosis not present

## 2022-10-28 DIAGNOSIS — G4733 Obstructive sleep apnea (adult) (pediatric): Secondary | ICD-10-CM | POA: Diagnosis not present

## 2022-12-24 DIAGNOSIS — E7800 Pure hypercholesterolemia, unspecified: Secondary | ICD-10-CM | POA: Diagnosis not present

## 2022-12-24 DIAGNOSIS — R739 Hyperglycemia, unspecified: Secondary | ICD-10-CM | POA: Diagnosis not present

## 2022-12-24 DIAGNOSIS — Z6841 Body Mass Index (BMI) 40.0 and over, adult: Secondary | ICD-10-CM | POA: Diagnosis not present

## 2022-12-24 DIAGNOSIS — Z79899 Other long term (current) drug therapy: Secondary | ICD-10-CM | POA: Diagnosis not present

## 2022-12-24 DIAGNOSIS — I10 Essential (primary) hypertension: Secondary | ICD-10-CM | POA: Diagnosis not present

## 2022-12-24 DIAGNOSIS — R5381 Other malaise: Secondary | ICD-10-CM | POA: Diagnosis not present

## 2022-12-24 DIAGNOSIS — R5383 Other fatigue: Secondary | ICD-10-CM | POA: Diagnosis not present

## 2023-01-06 DIAGNOSIS — G4733 Obstructive sleep apnea (adult) (pediatric): Secondary | ICD-10-CM | POA: Diagnosis not present

## 2023-02-05 DIAGNOSIS — G4733 Obstructive sleep apnea (adult) (pediatric): Secondary | ICD-10-CM | POA: Diagnosis not present

## 2023-03-08 DIAGNOSIS — G4733 Obstructive sleep apnea (adult) (pediatric): Secondary | ICD-10-CM | POA: Diagnosis not present

## 2023-03-27 DIAGNOSIS — R739 Hyperglycemia, unspecified: Secondary | ICD-10-CM | POA: Diagnosis not present

## 2023-03-27 DIAGNOSIS — Z6841 Body Mass Index (BMI) 40.0 and over, adult: Secondary | ICD-10-CM | POA: Diagnosis not present

## 2023-03-27 DIAGNOSIS — E7800 Pure hypercholesterolemia, unspecified: Secondary | ICD-10-CM | POA: Diagnosis not present

## 2023-03-27 DIAGNOSIS — E66813 Obesity, class 3: Secondary | ICD-10-CM | POA: Diagnosis not present

## 2023-03-27 DIAGNOSIS — I10 Essential (primary) hypertension: Secondary | ICD-10-CM | POA: Diagnosis not present

## 2023-07-07 DIAGNOSIS — G4733 Obstructive sleep apnea (adult) (pediatric): Secondary | ICD-10-CM | POA: Diagnosis not present

## 2023-07-17 DIAGNOSIS — I10 Essential (primary) hypertension: Secondary | ICD-10-CM | POA: Diagnosis not present

## 2023-07-17 DIAGNOSIS — R739 Hyperglycemia, unspecified: Secondary | ICD-10-CM | POA: Diagnosis not present

## 2023-07-17 DIAGNOSIS — Z79899 Other long term (current) drug therapy: Secondary | ICD-10-CM | POA: Diagnosis not present

## 2023-07-17 DIAGNOSIS — Z125 Encounter for screening for malignant neoplasm of prostate: Secondary | ICD-10-CM | POA: Diagnosis not present

## 2023-07-17 DIAGNOSIS — M25561 Pain in right knee: Secondary | ICD-10-CM | POA: Diagnosis not present

## 2023-07-17 DIAGNOSIS — E7800 Pure hypercholesterolemia, unspecified: Secondary | ICD-10-CM | POA: Diagnosis not present

## 2023-07-17 DIAGNOSIS — E66812 Obesity, class 2: Secondary | ICD-10-CM | POA: Diagnosis not present

## 2023-07-17 DIAGNOSIS — Z6838 Body mass index (BMI) 38.0-38.9, adult: Secondary | ICD-10-CM | POA: Diagnosis not present

## 2023-07-30 DIAGNOSIS — M25561 Pain in right knee: Secondary | ICD-10-CM | POA: Diagnosis not present

## 2023-07-30 DIAGNOSIS — S83411D Sprain of medial collateral ligament of right knee, subsequent encounter: Secondary | ICD-10-CM | POA: Diagnosis not present

## 2023-07-30 DIAGNOSIS — M1711 Unilateral primary osteoarthritis, right knee: Secondary | ICD-10-CM | POA: Diagnosis not present

## 2023-08-01 ENCOUNTER — Other Ambulatory Visit: Payer: Self-pay | Admitting: Orthopedic Surgery

## 2023-08-01 DIAGNOSIS — S83411A Sprain of medial collateral ligament of right knee, initial encounter: Secondary | ICD-10-CM

## 2023-08-04 ENCOUNTER — Encounter: Payer: Self-pay | Admitting: Orthopedic Surgery

## 2023-08-06 ENCOUNTER — Ambulatory Visit
Admission: RE | Admit: 2023-08-06 | Discharge: 2023-08-06 | Disposition: A | Discharge status: Home / Self Care | Source: Ambulatory Visit | Attending: Orthopedic Surgery | Admitting: Orthopedic Surgery

## 2023-08-06 DIAGNOSIS — S83411A Sprain of medial collateral ligament of right knee, initial encounter: Secondary | ICD-10-CM

## 2023-08-06 DIAGNOSIS — M25561 Pain in right knee: Secondary | ICD-10-CM | POA: Diagnosis not present

## 2023-08-06 DIAGNOSIS — M23321 Other meniscus derangements, posterior horn of medial meniscus, right knee: Secondary | ICD-10-CM | POA: Diagnosis not present

## 2023-08-06 DIAGNOSIS — M25461 Effusion, right knee: Secondary | ICD-10-CM | POA: Diagnosis not present

## 2023-09-15 DIAGNOSIS — J309 Allergic rhinitis, unspecified: Secondary | ICD-10-CM | POA: Diagnosis not present

## 2023-09-15 DIAGNOSIS — G4733 Obstructive sleep apnea (adult) (pediatric): Secondary | ICD-10-CM | POA: Diagnosis not present

## 2023-11-20 DIAGNOSIS — I10 Essential (primary) hypertension: Secondary | ICD-10-CM | POA: Diagnosis not present

## 2023-11-20 DIAGNOSIS — Z1331 Encounter for screening for depression: Secondary | ICD-10-CM | POA: Diagnosis not present

## 2023-11-20 DIAGNOSIS — Z0000 Encounter for general adult medical examination without abnormal findings: Secondary | ICD-10-CM | POA: Diagnosis not present

## 2023-11-20 DIAGNOSIS — Z6838 Body mass index (BMI) 38.0-38.9, adult: Secondary | ICD-10-CM | POA: Diagnosis not present

## 2023-11-20 DIAGNOSIS — E7800 Pure hypercholesterolemia, unspecified: Secondary | ICD-10-CM | POA: Diagnosis not present

## 2023-11-20 DIAGNOSIS — E66812 Obesity, class 2: Secondary | ICD-10-CM | POA: Diagnosis not present

## 2023-11-20 DIAGNOSIS — R739 Hyperglycemia, unspecified: Secondary | ICD-10-CM | POA: Diagnosis not present

## 2023-11-20 DIAGNOSIS — G4733 Obstructive sleep apnea (adult) (pediatric): Secondary | ICD-10-CM | POA: Diagnosis not present

## 2023-11-20 DIAGNOSIS — Z79899 Other long term (current) drug therapy: Secondary | ICD-10-CM | POA: Diagnosis not present

## 2023-11-28 DIAGNOSIS — G4733 Obstructive sleep apnea (adult) (pediatric): Secondary | ICD-10-CM | POA: Diagnosis not present

## 2023-12-17 DIAGNOSIS — G4733 Obstructive sleep apnea (adult) (pediatric): Secondary | ICD-10-CM | POA: Diagnosis not present

## 2024-01-16 DIAGNOSIS — G4733 Obstructive sleep apnea (adult) (pediatric): Secondary | ICD-10-CM | POA: Diagnosis not present

## 2024-02-16 DIAGNOSIS — G4733 Obstructive sleep apnea (adult) (pediatric): Secondary | ICD-10-CM | POA: Diagnosis not present

## 2024-02-25 DIAGNOSIS — Z79899 Other long term (current) drug therapy: Secondary | ICD-10-CM | POA: Diagnosis not present

## 2024-02-25 DIAGNOSIS — E7800 Pure hypercholesterolemia, unspecified: Secondary | ICD-10-CM | POA: Diagnosis not present

## 2024-02-26 DIAGNOSIS — E7800 Pure hypercholesterolemia, unspecified: Secondary | ICD-10-CM | POA: Diagnosis not present

## 2024-02-26 DIAGNOSIS — R739 Hyperglycemia, unspecified: Secondary | ICD-10-CM | POA: Diagnosis not present

## 2024-02-26 DIAGNOSIS — Z6838 Body mass index (BMI) 38.0-38.9, adult: Secondary | ICD-10-CM | POA: Diagnosis not present

## 2024-02-26 DIAGNOSIS — G4733 Obstructive sleep apnea (adult) (pediatric): Secondary | ICD-10-CM | POA: Diagnosis not present

## 2024-02-26 DIAGNOSIS — E66812 Obesity, class 2: Secondary | ICD-10-CM | POA: Diagnosis not present

## 2024-03-17 DIAGNOSIS — G4733 Obstructive sleep apnea (adult) (pediatric): Secondary | ICD-10-CM | POA: Diagnosis not present
